# Patient Record
Sex: Male | Born: 1970 | Race: Black or African American | Hispanic: No | Marital: Married | Smoking: Never smoker
Health system: Southern US, Community
[De-identification: ages and names within clinical notes are randomized; demographics above are authoritative.]

## PROBLEM LIST (undated history)

## (undated) DIAGNOSIS — Z789 Other specified health status: Secondary | ICD-10-CM

## (undated) DIAGNOSIS — R7303 Prediabetes: Secondary | ICD-10-CM

---

## 1999-08-27 ENCOUNTER — Emergency Department (HOSPITAL_COMMUNITY): Admission: EM | Admit: 1999-08-27 | Discharge: 1999-08-27 | Payer: Self-pay | Admitting: Emergency Medicine

## 2000-12-06 ENCOUNTER — Emergency Department (HOSPITAL_COMMUNITY): Admission: EM | Admit: 2000-12-06 | Discharge: 2000-12-06 | Payer: Self-pay | Admitting: Emergency Medicine

## 2002-02-07 ENCOUNTER — Emergency Department (HOSPITAL_COMMUNITY): Admission: EM | Admit: 2002-02-07 | Discharge: 2002-02-07 | Payer: Self-pay | Admitting: Emergency Medicine

## 2003-02-27 ENCOUNTER — Encounter: Payer: Self-pay | Admitting: Emergency Medicine

## 2003-02-27 ENCOUNTER — Emergency Department (HOSPITAL_COMMUNITY): Admission: EM | Admit: 2003-02-27 | Discharge: 2003-02-27 | Payer: Self-pay | Admitting: Emergency Medicine

## 2003-05-13 ENCOUNTER — Encounter (HOSPITAL_BASED_OUTPATIENT_CLINIC_OR_DEPARTMENT_OTHER): Payer: Self-pay | Admitting: General Surgery

## 2003-05-13 ENCOUNTER — Ambulatory Visit (HOSPITAL_COMMUNITY): Admission: RE | Admit: 2003-05-13 | Discharge: 2003-05-13 | Payer: Self-pay | Admitting: General Surgery

## 2003-05-25 ENCOUNTER — Encounter (HOSPITAL_BASED_OUTPATIENT_CLINIC_OR_DEPARTMENT_OTHER): Payer: Self-pay | Admitting: General Surgery

## 2003-05-26 ENCOUNTER — Ambulatory Visit (HOSPITAL_COMMUNITY): Admission: RE | Admit: 2003-05-26 | Discharge: 2003-05-26 | Payer: Self-pay | Admitting: General Surgery

## 2003-05-26 ENCOUNTER — Encounter (INDEPENDENT_AMBULATORY_CARE_PROVIDER_SITE_OTHER): Payer: Self-pay

## 2004-09-25 ENCOUNTER — Emergency Department (HOSPITAL_COMMUNITY): Admission: EM | Admit: 2004-09-25 | Discharge: 2004-09-25 | Payer: Self-pay | Admitting: Emergency Medicine

## 2004-10-03 ENCOUNTER — Encounter: Admission: RE | Admit: 2004-10-03 | Discharge: 2004-10-31 | Payer: Self-pay | Admitting: Family Medicine

## 2009-12-11 ENCOUNTER — Emergency Department (HOSPITAL_COMMUNITY): Admission: EM | Admit: 2009-12-11 | Discharge: 2009-12-11 | Payer: Self-pay | Admitting: Emergency Medicine

## 2011-03-03 NOTE — Op Note (Signed)
NAME:  Trevor West, Trevor West                         ACCOUNT NO.:  [id number]   MEDICAL RECORD NO.:  [id number]                   PATIENT TYPE:  AMB   LOCATION:  DAY                                  FACILITY:  [facility]   PHYSICIAN:  Leonie Man, M.D.                DATE OF BIRTH:  [birth year]   DATE OF PROCEDURE:  05/26/2003  DATE OF DISCHARGE:                                 OPERATIVE REPORT   PREOPERATIVE DIAGNOSIS:  Mass of left hip, 12 x 12 cm, probable organized  hematoma.   POSTOPERATIVE DIAGNOSIS:  Mass of left hip, 12 x 12 cm, probable organized  hematoma.   OPERATION/PROCEDURE:  Excision mass of left hip.   SURGEON:  Leonie Man, M.D.   ASSISTANT:  Nurse.   ANESTHESIA:  General.   INDICATIONS:  This patient is a 40 year old man presenting with a large and  enlarging painless mass which has been present for about the last six months  over his great trochanter and acetabular area.  He underwent an MRI of this  mass which showed a large partially cystic, partially solid mass which was  then penetrated from the subcutaneous tissues down into the gluteal muscles.  He comes to the operating room now after risks and potential benefits of the  surgery have been fully discussed. All questions answered and consent  obtained.   DESCRIPTION OF PROCEDURE:  Following the induction of satisfactory general  anesthesia, the patient was positioned in the left lateral position.  The  left hip was prepped and draped into a full operative field.  A transverse  incision was carried down over the mass, through the skin and subcutaneous  tissue, carrying the dissection down to the capsule of the mass.  The mass  was dissected free on all sides, carrying it down into the gluteus muscle.  Upon entering into the gluteus muscle, capsule was opened and there was a  large amount of chocolate-colored debris within the mass and within the  gluteus.  The mass itself was excised and the debris from  within the gluteus  muscle was debrided completely and irrigated free.  The mass was sent for  frozen section.  Frozen section diagnosis confirmed this to be a benign  hematoma with a large fibrous sac.  Hemostasis was obtained with  electrocautery.  Sponge and instrument counts were verified.  The wound was  drained with a size 19-French Blake round drain.  The subcutaneous tissues  were then closed with interrupted 2-0 Vicryl suture and skin closed with 4-0  Monocryl suture, was then reinforced with Steri-Strips, the drain secured to  the skin and charged.  Sterile dressings applied.  Anesthetic was then  reversed, the patient removed from the operating room to the recovery room  in stable condition.  He tolerated the procedure well.  Leonie Man, M.D.    PB/MEDQ  D:  05/26/2003  T:  05/26/2003  Job:  [id number]

## 2016-01-15 ENCOUNTER — Emergency Department (HOSPITAL_COMMUNITY)
Admission: EM | Admit: 2016-01-15 | Discharge: 2016-01-15 | Disposition: A | Discharge status: Home / Self Care | Payer: Self-pay | Attending: Emergency Medicine | Admitting: Emergency Medicine

## 2016-01-15 ENCOUNTER — Emergency Department (HOSPITAL_COMMUNITY): Payer: Self-pay

## 2016-01-15 ENCOUNTER — Encounter (HOSPITAL_COMMUNITY): Payer: Self-pay | Admitting: Emergency Medicine

## 2016-01-15 DIAGNOSIS — R197 Diarrhea, unspecified: Secondary | ICD-10-CM | POA: Insufficient documentation

## 2016-01-15 DIAGNOSIS — R0602 Shortness of breath: Secondary | ICD-10-CM | POA: Insufficient documentation

## 2016-01-15 DIAGNOSIS — R6889 Other general symptoms and signs: Secondary | ICD-10-CM

## 2016-01-15 DIAGNOSIS — R509 Fever, unspecified: Secondary | ICD-10-CM | POA: Insufficient documentation

## 2016-01-15 DIAGNOSIS — R1110 Vomiting, unspecified: Secondary | ICD-10-CM | POA: Insufficient documentation

## 2016-01-15 DIAGNOSIS — R05 Cough: Secondary | ICD-10-CM | POA: Insufficient documentation

## 2016-01-15 DIAGNOSIS — J3489 Other specified disorders of nose and nasal sinuses: Secondary | ICD-10-CM | POA: Insufficient documentation

## 2016-01-15 DIAGNOSIS — M791 Myalgia: Secondary | ICD-10-CM | POA: Insufficient documentation

## 2016-01-15 DIAGNOSIS — R000 Tachycardia, unspecified: Secondary | ICD-10-CM | POA: Insufficient documentation

## 2016-01-15 LAB — COMPREHENSIVE METABOLIC PANEL
ALT: 33 U/L (ref 17–63)
AST: 29 U/L (ref 15–41)
Albumin: 4.5 g/dL (ref 3.5–5.0)
Alkaline Phosphatase: 46 U/L (ref 38–126)
Anion gap: 10 (ref 5–15)
BUN: 16 mg/dL (ref 6–20)
CO2: 22 mmol/L (ref 22–32)
CREATININE: 1.13 mg/dL (ref 0.61–1.24)
Calcium: 9.2 mg/dL (ref 8.9–10.3)
Chloride: 103 mmol/L (ref 101–111)
GFR calc Af Amer: >60 mL/min (ref >60)
GFR calc non Af Amer: >60 mL/min (ref >60)
GLUCOSE: 116 mg/dL — AB (ref 65–99)
Potassium: 4.3 mmol/L (ref 3.5–5.1)
Sodium: 135 mmol/L (ref 135–145)
Total Bilirubin: 1.1 mg/dL (ref 0.3–1.2)
Total Protein: 8.2 g/dL — ABNORMAL HIGH (ref 6.5–8.1)

## 2016-01-15 LAB — URINALYSIS, ROUTINE W REFLEX MICROSCOPIC
BILIRUBIN URINE: NEGATIVE
GLUCOSE, UA: NEGATIVE mg/dL
Hgb urine dipstick: NEGATIVE
Ketones, ur: 15 mg/dL — AB
Leukocytes, UA: NEGATIVE
Nitrite: NEGATIVE
PROTEIN: NEGATIVE mg/dL
Specific Gravity, Urine: 1.021 (ref 1.005–1.030)
pH: 6.5 (ref 5.0–8.0)

## 2016-01-15 LAB — CBC
HCT: 43.9 % (ref 39.0–52.0)
Hemoglobin: 14.5 g/dL (ref 13.0–17.0)
MCH: 25.6 pg — AB (ref 26.0–34.0)
MCHC: 33 g/dL (ref 30.0–36.0)
MCV: 77.6 fL — ABNORMAL LOW (ref 78.0–100.0)
Platelets: 280 10*3/uL (ref 150–400)
RBC: 5.66 MIL/uL (ref 4.22–5.81)
RDW: 14.4 % (ref 11.5–15.5)
WBC: 7.4 10*3/uL (ref 4.0–10.5)

## 2016-01-15 LAB — RAPID STREP SCREEN (MED CTR MEBANE ONLY): Streptococcus, Group A Screen (Direct): NEGATIVE

## 2016-01-15 LAB — I-STAT CG4 LACTIC ACID, ED: Lactic Acid, Venous: 0.73 mmol/L (ref 0.5–2.0)

## 2016-01-15 LAB — LIPASE, BLOOD: Lipase: 25 U/L (ref 11–51)

## 2016-01-15 MED ORDER — BENZONATATE 100 MG PO CAPS
100.0000 mg | ORAL_CAPSULE | Freq: Once | ORAL | Status: AC
  Administered 2016-01-15: 100 mg via ORAL
  Filled 2016-01-15: qty 1

## 2016-01-15 MED ORDER — ACETAMINOPHEN 325 MG PO TABS
650.0000 mg | ORAL_TABLET | Freq: Once | ORAL | Status: AC
  Administered 2016-01-15: 650 mg via ORAL
  Filled 2016-01-15: qty 2

## 2016-01-15 MED ORDER — ALBUTEROL SULFATE (2.5 MG/3ML) 0.083% IN NEBU
2.5000 mg | INHALATION_SOLUTION | Freq: Once | RESPIRATORY_TRACT | Status: AC
  Administered 2016-01-15: 2.5 mg via RESPIRATORY_TRACT
  Filled 2016-01-15: qty 3

## 2016-01-15 MED ORDER — NAPROXEN 500 MG PO TABS: 500.0000 mg | ORAL_TABLET | Freq: Two times a day (BID) | ORAL | Status: AC | PRN

## 2016-01-15 MED ORDER — IPRATROPIUM-ALBUTEROL 0.5-2.5 (3) MG/3ML IN SOLN
3.0000 mL | Freq: Once | RESPIRATORY_TRACT | Status: AC
  Administered 2016-01-15: 3 mL via RESPIRATORY_TRACT
  Filled 2016-01-15: qty 3

## 2016-01-15 MED ORDER — FLUTICASONE PROPIONATE 50 MCG/ACT NA SUSP: 2.0000 | Freq: Every day | NASAL | Status: AC | PRN

## 2016-01-15 MED ORDER — ALBUTEROL SULFATE HFA 108 (90 BASE) MCG/ACT IN AERS
2.0000 | INHALATION_SPRAY | Freq: Once | RESPIRATORY_TRACT | Status: AC
  Administered 2016-01-15: 2 via RESPIRATORY_TRACT
  Filled 2016-01-15: qty 6.7

## 2016-01-15 MED ORDER — BENZONATATE 100 MG PO CAPS: 100.0000 mg | ORAL_CAPSULE | Freq: Three times a day (TID) | ORAL | Status: AC | PRN

## 2016-01-15 MED ORDER — METHYLPREDNISOLONE SODIUM SUCC 125 MG IJ SOLR
125.0000 mg | Freq: Once | INTRAMUSCULAR | Status: AC
  Administered 2016-01-15: 125 mg via INTRAVENOUS
  Filled 2016-01-15: qty 2

## 2016-01-15 MED ORDER — KETOROLAC TROMETHAMINE 30 MG/ML IJ SOLN
30.0000 mg | Freq: Once | INTRAMUSCULAR | Status: AC
  Administered 2016-01-15: 30 mg via INTRAVENOUS
  Filled 2016-01-15: qty 1

## 2016-01-15 NOTE — ED Notes (Signed)
Pt from home with fever, chills, cough, nausea, vomiting, and diarrhea x 2 days. Pt has had 2 episodes of emesis and 1 of diarrhea in the past 24 hours. Pt's wife states she just got over the flu.

## 2016-01-15 NOTE — ED Provider Notes (Signed)
CSN: [id number]     Arrival date & time 01/15/16  0256 History   First MD Initiated Contact with Patient 01/15/16 [phone]     Chief Complaint  Patient presents with  . Cough  . Fever  . Emesis  . Diarrhea     (Consider location/radiation/quality/duration/timing/severity/associated sxs/prior Treatment) HPI Comments: 45 year old male with no significant past medical history presents to the emergency department for evaluation of shortness of breath. Patient states that he developed worsening shortness of breath 2 hours prior to arrival. Patient denies any modifying factors of his symptoms. No medications taken prior to coming to the ED. Patient has had an associated cough which is productive as well as chest congestion. He reports body aches as well as subjective fever and chills. Associated symptoms preceded shortness of breath by 3 days. He reports that his wife has recently been sick with a flulike illness. Patient with 2 episodes of emesis today and one episode of looser stool. He denies loss of consciousness, chest pain, abdominal pain, hemoptysis, hematemesis, melena, and hematochezia. No history of asthma, per patient. Patient has been taking ibuprofen for body aches without relief.  Patient is a 45 y.o. male presenting with cough, fever, vomiting, and diarrhea. The history is provided by the patient and the spouse. No language interpreter was used.  Cough Associated symptoms: chills, fever, myalgias, rhinorrhea and shortness of breath   Associated symptoms: no chest pain   Fever Associated symptoms: chills, congestion, cough, diarrhea, myalgias, rhinorrhea and vomiting   Associated symptoms: no chest pain   Emesis Associated symptoms: chills, diarrhea and myalgias   Associated symptoms: no abdominal pain   Diarrhea Associated symptoms: chills, fever, myalgias and vomiting   Associated symptoms: no abdominal pain     History reviewed. No pertinent past medical history. History  reviewed. No pertinent past surgical history. No family history on file. Social History  Substance Use Topics  . Smoking status: Never Smoker   . Smokeless tobacco: None  . Alcohol Use: Yes     Comment: occasionally     Review of Systems  Constitutional: Positive for fever and chills.  HENT: Positive for congestion and rhinorrhea.   Respiratory: Positive for cough and shortness of breath.   Cardiovascular: Negative for chest pain.  Gastrointestinal: Positive for vomiting and diarrhea. Negative for abdominal pain.  Musculoskeletal: Positive for myalgias.  All other systems reviewed and are negative.   Allergies  Review of patient's allergies indicates not on file.  Home Medications   Prior to Admission medications   Not on File   BP 137/91 mmHg  Pulse 98  Temp(Src) 99.8 F (37.7 C) (Oral)  Resp 22  SpO2 97%   Physical Exam  Constitutional: He is oriented to person, place, and time. He appears well-developed and well-nourished. No distress.  Nontoxic appearing  HENT:  Head: Normocephalic and atraumatic.  Eyes: Conjunctivae and EOM are normal. No scleral icterus.  Neck: Normal range of motion.  Cardiovascular: Regular rhythm and intact distal pulses.   Mild tachycardia  Pulmonary/Chest: Effort normal and breath sounds normal. No respiratory distress. He has no wheezes. He has no rales.  Patient moving air well. No tachypnea or accessory muscle usage. Lungs sounds clear bilaterally. No wheezing, rales, or rhonchi.  Musculoskeletal: Normal range of motion.  Neurological: He is alert and oriented to person, place, and time. He exhibits normal muscle tone. Coordination normal.  Skin: Skin is warm and dry. No rash noted. He is not diaphoretic. No erythema. No pallor.  Psychiatric: He has a normal mood and affect. His behavior is normal.  Nursing note and vitals reviewed.   ED Course  Procedures (including critical care time) Labs Review Labs Reviewed  COMPREHENSIVE  METABOLIC PANEL - Abnormal; Notable for the following:    Glucose, Bld 116 (*)    Total Protein 8.2 (*)    All other components within normal limits  CBC - Abnormal; Notable for the following:    MCV 77.6 (*)    MCH 25.6 (*)    All other components within normal limits  URINALYSIS, ROUTINE W REFLEX MICROSCOPIC (NOT AT [facility]) - Abnormal; Notable for the following:    Ketones, ur 15 (*)    All other components within normal limits  RAPID STREP SCREEN (NOT AT [facility])  CULTURE, GROUP A STREP ([facility])  LIPASE, BLOOD  I-STAT CG4 LACTIC ACID, ED    Imaging Review Dg Chest 2 View  01/15/2016  CLINICAL DATA:  Pt from home with fever, chills, cough, nausea, vomiting, and diarrhea x 2 days. Pt has had 2 episodes of emesis and 1 of diarrhea in the past 24 hours. Pt's wife states she just got over the flu. EXAM: CHEST  2 VIEW COMPARISON:  None. FINDINGS: Left hemidiaphragm elevation and with bilateral diaphragmatic eventration. Midline trachea. Normal heart size and mediastinal contours. No pleural effusion or pneumothorax. Clear lungs. IMPRESSION: No acute cardiopulmonary disease. Electronically Signed   By: Jeronimo GreavesKyle  Talbot M.D.   On: 01/15/2016 03:49   I have personally reviewed and evaluated these images and lab results as part of my medical decision-making.   EKG Interpretation None      MDM   Final diagnoses:  Flu-like symptoms    45 year old male presents to the emergency department for flulike symptoms which began 3 days ago. Wife was recently sick with similar symptoms. Patient afebrile on arrival without tachycardia or hypotension. No hypoxia. NO Sepsis/SIRS criteria. Patient with largest complaint of shortness of breath, mostly when sleeping and lying supine. He states that he feels as though he needs to "catch my breath" when he is falling asleep. Patient, however, has not experienced hypoxia in the ED when resting or with ambulation.  Symptoms mildly improved after DuoNeb and saline  nebulizer. Chest x-ray shows no evidence of pneumonia or other acute cardiopulmonary abnormality. Laboratory workup is reassuring. There is no leukocytosis or elevated lactate. Suspect influenza as cause of symptoms today. No indication for inpatient management at this time. Will continue with albuterol on an outpatient basis. Patient prescribed Tessalon for cough as well as Flonase and Naproxen. Primary care for up advised and return precautions given. Patient discharged in satisfactory condition with no unaddressed concerns. Patient case discussed with my attending, Dr. Karma GanjaLinker, who is in agreement with this workup, assessment, management plan, and patient's stability for discharge.   Filed Vitals:   01/15/16 0300 01/15/16 0454 01/15/16 0556  BP: 137/91  133/77  Pulse: 98  96  Temp: 99.8 F (37.7 C)    TempSrc: Oral    Resp: 22  20  SpO2: 97% 98% 97%       Antony MaduraKelly Zyrell Carmean, PA-C 01/15/16 [id number]  Jerelyn ScottMartha Linker, MD 01/15/16 [phone]

## 2016-01-15 NOTE — Discharge Instructions (Signed)
Take tylenol for fever and Naproxen for body aches. Use an albuterol inhaler, 2 puffs every 4-6 hours, as needed for shortness of breath. You may take Mucinex for congestion and/or use Flonase as prescribed. Take tessalon for cough. Follow up with your primary care doctor for a recheck of symptoms. Return to the ED if symptoms worsen.  Influenza, Adult Influenza ("the flu") is a viral infection of the respiratory tract. It occurs more often in winter months because people spend more time in close contact with one another. Influenza can make you feel very sick. Influenza easily spreads from person to person (contagious). CAUSES  Influenza is caused by a virus that infects the respiratory tract. You can catch the virus by breathing in droplets from an infected person's cough or sneeze. You can also catch the virus by touching something that was recently contaminated with the virus and then touching your mouth, nose, or eyes. RISKS AND COMPLICATIONS You may be at risk for a more severe case of influenza if you smoke cigarettes, have diabetes, have chronic heart disease (such as heart failure) or lung disease (such as asthma), or if you have a weakened immune system. Elderly people and pregnant women are also at risk for more serious infections. The most common problem of influenza is a lung infection (pneumonia). Sometimes, this problem can require emergency medical care and may be life threatening. SIGNS AND SYMPTOMS  Symptoms typically last 4 to 10 days and may include:  Fever.  Chills.  Headache, body aches, and muscle aches.  Sore throat.  Chest discomfort and cough.  Poor appetite.  Weakness or feeling tired.  Dizziness.  Nausea or vomiting. DIAGNOSIS  Diagnosis of influenza is often made based on your history and a physical exam. A nose or throat swab test can be done to confirm the diagnosis. TREATMENT  In mild cases, influenza goes away on its own. Treatment is directed at  relieving symptoms. For more severe cases, your health care provider may prescribe antiviral medicines to shorten the sickness. Antibiotic medicines are not effective because the infection is caused by a virus, not by bacteria. HOME CARE INSTRUCTIONS  Take medicines only as directed by your health care provider.  Use a cool mist humidifier to make breathing easier.  Get plenty of rest until your temperature returns to normal. This usually takes 3 to 4 days.  Drink enough fluid to keep your urine clear or pale yellow.  Cover yourmouth and nosewhen coughing or sneezing,and wash your handswellto prevent thevirusfrom spreading.  Stay homefromwork orschool untilthe fever is gonefor at least 531full day. PREVENTION  An annual influenza vaccination (flu shot) is the best way to avoid getting influenza. An annual flu shot is now routinely recommended for all adults in the [place] SEEK MEDICAL CARE IF:  You experiencechest pain, yourcough worsens,or you producemore mucus.  Youhave nausea,vomiting, ordiarrhea.  Your fever returns or gets worse. SEEK IMMEDIATE MEDICAL CARE IF:  You havetrouble breathing, you become short of breath,or your skin ornails becomebluish.  You have severe painor stiffnessin the neck.  You develop a sudden headache, or pain in the face or ear.  You have nausea or vomiting that you cannot control. MAKE SURE YOU:   Understand these instructions.  Will watch your condition.  Will get help right away if you are not doing well or get worse.   This information is not intended to replace advice given to you by your health care provider. Make sure you discuss any questions you  have with your health care provider.   Document Released: 09/29/2000 Document Revised: 10/23/2014 Document Reviewed: 01/01/2012 Elsevier Interactive Patient Education [employer].

## 2016-01-17 LAB — CULTURE, GROUP A STREP (THRC)

## 2017-11-08 IMAGING — CR DG CHEST 2V
2 series · 2 of 2 positions shown · non-contrast
Comparison: None.

CLINICAL DATA: Pt from home with fever, chills, cough, nausea,
vomiting, and diarrhea x 2 days. Pt has had 2 episodes of emesis and
1 of diarrhea in the past 24 hours. Pt's wife states she just got
over the flu.

EXAM:
CHEST  2 VIEW

[w chest pa]
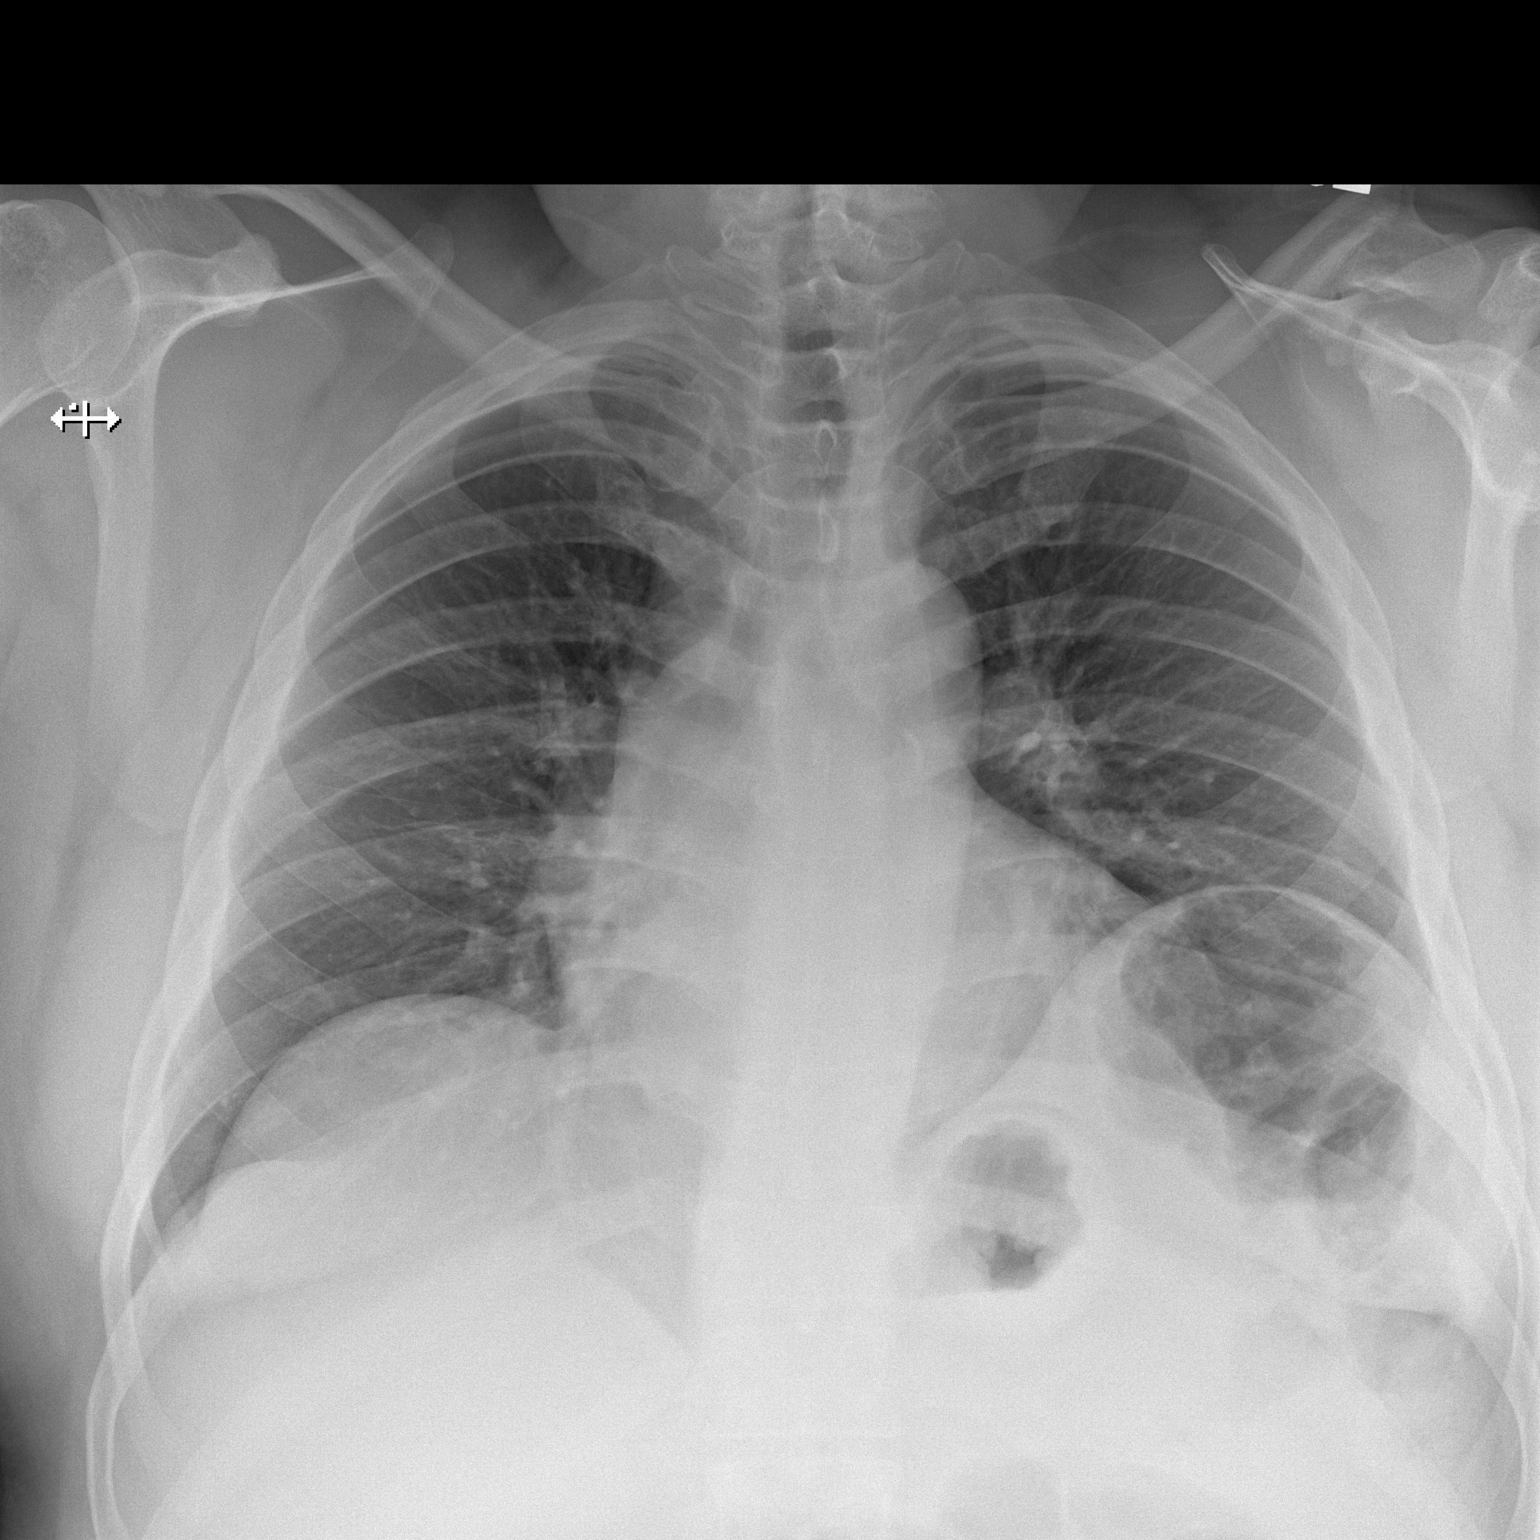

[w chest lat]
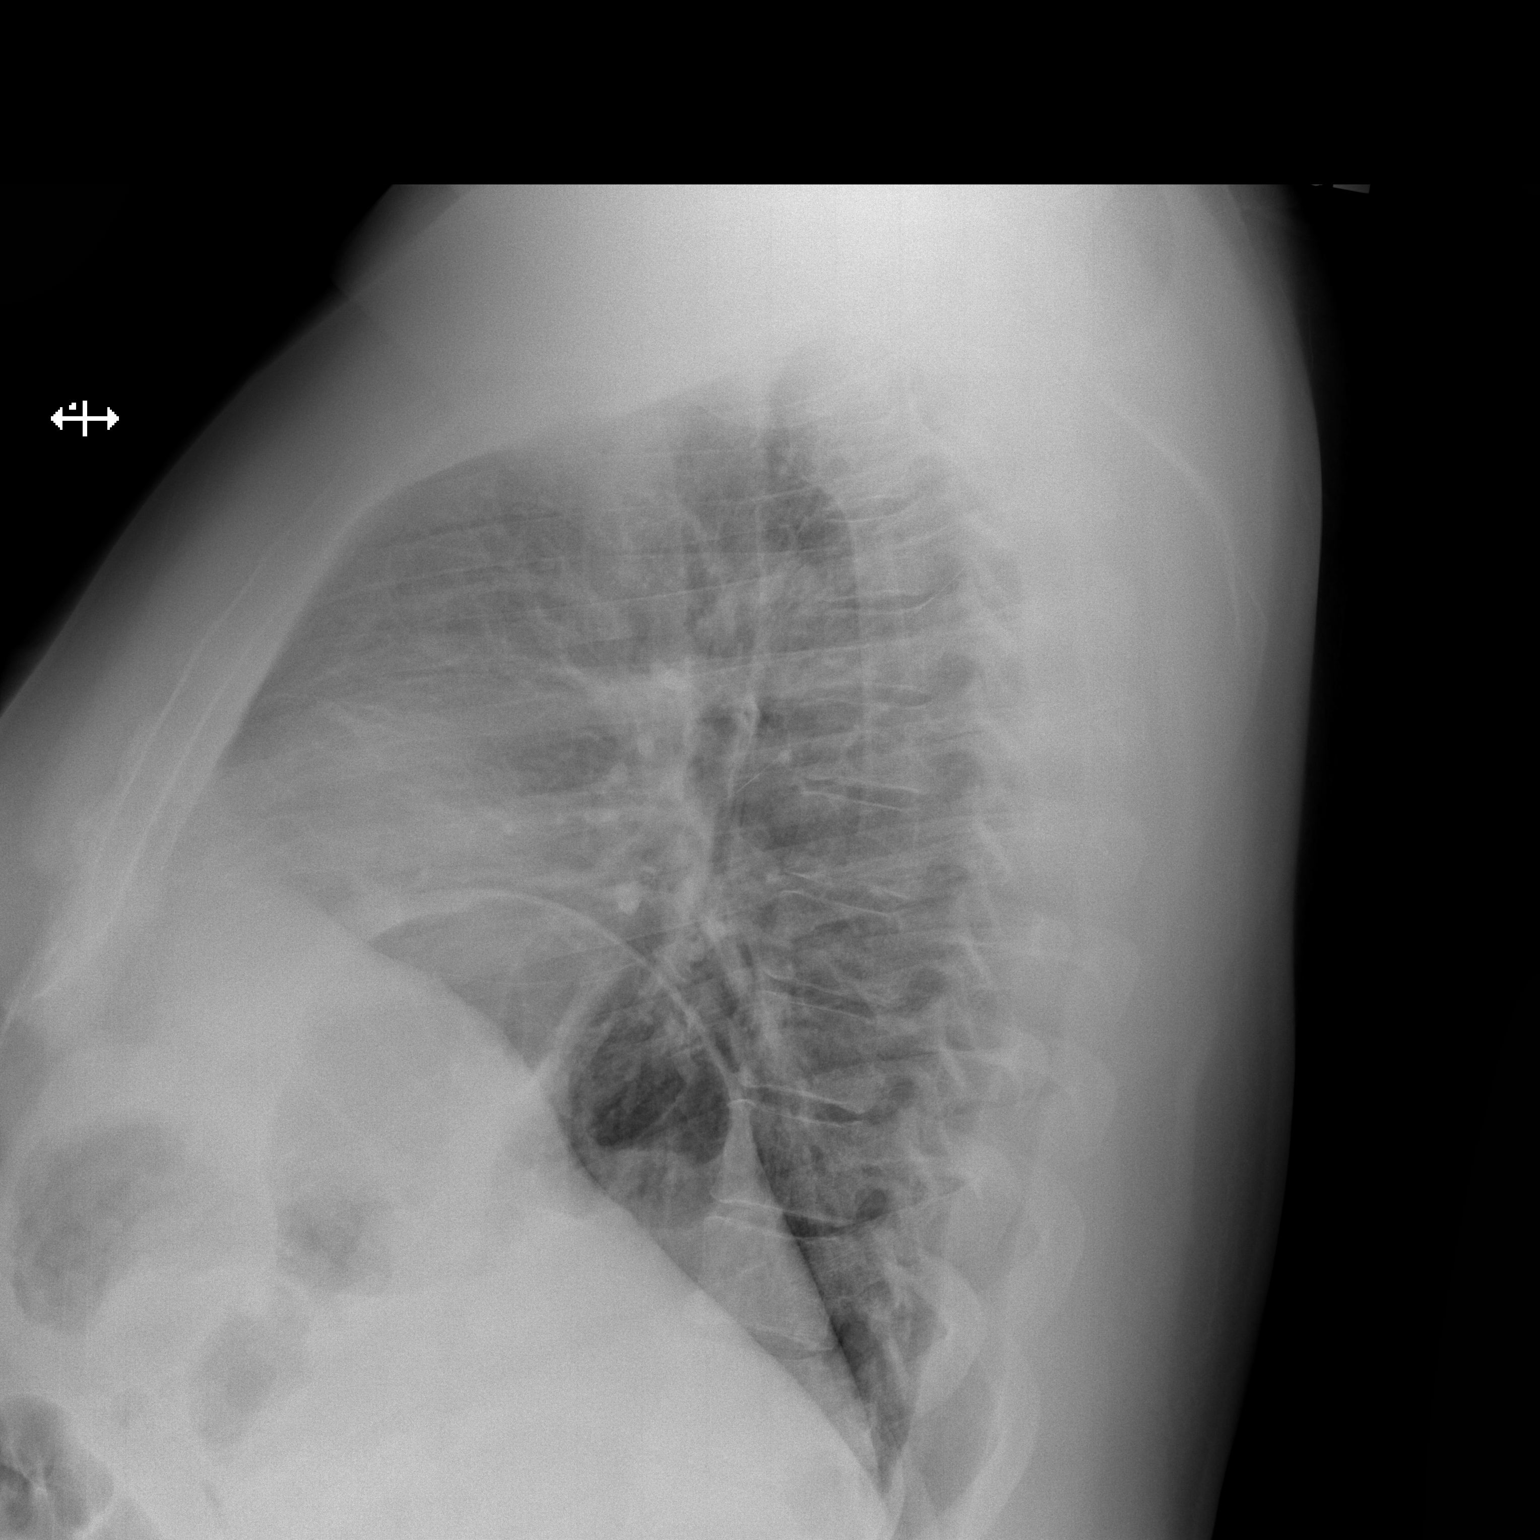

[2 of 2 positions shown; findings below may reference images not displayed]

FINDINGS: Left hemidiaphragm elevation and with bilateral diaphragmatic
eventration. Midline trachea. Normal heart size and mediastinal
contours. No pleural effusion or pneumothorax. Clear lungs.
IMPRESSION: No acute cardiopulmonary disease.

## 2017-12-28 ENCOUNTER — Other Ambulatory Visit (HOSPITAL_COMMUNITY): Payer: Self-pay | Admitting: General Surgery

## 2018-01-09 ENCOUNTER — Ambulatory Visit: Payer: BLUE CROSS/BLUE SHIELD | Admitting: Registered"

## 2018-01-15 ENCOUNTER — Encounter (HOSPITAL_COMMUNITY): Payer: Self-pay

## 2018-01-15 ENCOUNTER — Ambulatory Visit (HOSPITAL_COMMUNITY): Payer: BLUE CROSS/BLUE SHIELD

## 2021-01-09 ENCOUNTER — Encounter (HOSPITAL_COMMUNITY): Payer: Self-pay

## 2021-01-09 ENCOUNTER — Emergency Department (HOSPITAL_COMMUNITY)
Admission: EM | Admit: 2021-01-09 | Discharge: 2021-01-09 | Disposition: A | Discharge status: Home / Self Care | Payer: BC Managed Care – PPO | Attending: Emergency Medicine | Admitting: Emergency Medicine

## 2021-01-09 ENCOUNTER — Other Ambulatory Visit: Payer: Self-pay

## 2021-01-09 ENCOUNTER — Emergency Department (HOSPITAL_COMMUNITY): Payer: BC Managed Care – PPO

## 2021-01-09 DIAGNOSIS — R072 Precordial pain: Secondary | ICD-10-CM | POA: Diagnosis present

## 2021-01-09 HISTORY — DX: Other specified health status: Z78.9

## 2021-01-09 LAB — CBC
HCT: 43.1 % (ref 39.0–52.0)
Hemoglobin: 13.6 g/dL (ref 13.0–17.0)
MCH: 25.7 pg — ABNORMAL LOW (ref 26.0–34.0)
MCHC: 31.6 g/dL (ref 30.0–36.0)
MCV: 81.3 fL (ref 80.0–100.0)
Platelets: 300 10*3/uL (ref 150–400)
RBC: 5.3 MIL/uL (ref 4.22–5.81)
RDW: 14.5 % (ref 11.5–15.5)
WBC: 4.5 10*3/uL (ref 4.0–10.5)
nRBC: 0 % (ref 0.0–0.2)

## 2021-01-09 LAB — BASIC METABOLIC PANEL
Anion gap: 7 (ref 5–15)
BUN: 14 mg/dL (ref 6–20)
CO2: 27 mmol/L (ref 22–32)
Calcium: 8.5 mg/dL — ABNORMAL LOW (ref 8.9–10.3)
Chloride: 104 mmol/L (ref 98–111)
Creatinine, Ser: 1.07 mg/dL (ref 0.61–1.24)
GFR, Estimated: >60 mL/min (ref >60)
Glucose, Bld: 110 mg/dL — ABNORMAL HIGH (ref 70–99)
Potassium: 3.6 mmol/L (ref 3.5–5.1)
Sodium: 138 mmol/L (ref 135–145)

## 2021-01-09 LAB — TROPONIN I (HIGH SENSITIVITY): Troponin I (High Sensitivity): 4 ng/L (ref <18)

## 2021-01-09 MED ORDER — DICLOFENAC SODIUM 1 % EX GEL: 2.0000 g | Freq: Four times a day (QID) | CUTANEOUS | 0 refills | Status: AC

## 2021-01-09 NOTE — Discharge Instructions (Signed)
You were seen in the emergency department today with chest pain.  Your work-up today did not show evidence of a heart attack.  If your symptoms suddenly worsen or change he should return to the emergency department, otherwise please follow with your primary care doctor in the coming week.

## 2021-01-09 NOTE — ED Provider Notes (Signed)
Emergency Department Provider Note   I have reviewed the triage vital signs and the nursing notes.   HISTORY  Chief Complaint No chief complaint on file.   HPI Trevor West is a 50 y.o. male with past medical history reviewed below presents to the emergency department with intermittent, brief, pulling chest discomfort in the left upper chest.  No provoking or modifying factors.  Patient describes pain which comes intermittently and does not radiate.  He is not feeling short of breath.  No active pain at this time.  He states and the pain does start it last for only a second or two and then resolve.  No pleuritic pain. No fever/chills. No DVT/PT history. Pain as been intermittent over the last 2 days.   Past Medical History:  Diagnosis Date  . Known health problems: none     There are no problems to display for this patient.   No past surgical history on file.  Allergies Patient has no known allergies.  No family history on file.  Social History Social History   Tobacco Use  . Smoking status: Never Smoker  . Smokeless tobacco: Never Used  Vaping Use  . Vaping Use: Never used  Substance Use Topics  . Alcohol use: Yes    Comment: occasionally   . Drug use: Not Currently    Review of Systems  Constitutional: No fever/chills Eyes: No visual changes. ENT: No sore throat. Cardiovascular: Positive chest pain. Respiratory: Denies shortness of breath. Gastrointestinal: No abdominal pain.  No nausea, no vomiting.  No diarrhea.  No constipation. Genitourinary: Negative for dysuria. Musculoskeletal: Negative for back pain. Skin: Negative for rash. Neurological: Negative for headaches, focal weakness or numbness.  10-point ROS otherwise negative.  ____________________________________________   PHYSICAL EXAM:  VITAL SIGNS: ED Triage Vitals  Enc Vitals Group     BP 01/09/21 0419 (!) 151/104     Pulse Rate 01/09/21 0419 73     Resp 01/09/21 0419 20     Temp  01/09/21 0419 97.7 F (36.5 C)     Temp Source 01/09/21 0419 Oral     SpO2 01/09/21 0419 94 %     Weight --      Height 01/09/21 0427 6' (1.829 m)   Constitutional: Alert and oriented. Well appearing and in no acute distress. Eyes: Conjunctivae are normal.  Head: Atraumatic. Nose: No congestion/rhinnorhea. Mouth/Throat: Mucous membranes are moist.  Neck: No stridor.   Cardiovascular: Normal rate, regular rhythm. Good peripheral circulation. Grossly normal heart sounds.   Respiratory: Normal respiratory effort.  No retractions. Lungs CTAB. Gastrointestinal: Soft and nontender. No distention.  Musculoskeletal: No lower extremity tenderness nor edema. No gross deformities of extremities. Neurologic:  Normal speech and language. No gross focal neurologic deficits are appreciated.  Skin:  Skin is warm, dry and intact. No rash noted.  ____________________________________________   LABS (all labs ordered are listed, but only abnormal results are displayed)  Labs Reviewed  BASIC METABOLIC PANEL - Abnormal; Notable for the following components:      Result Value   Glucose, Bld 110 (*)    Calcium 8.5 (*)    All other components within normal limits  CBC - Abnormal; Notable for the following components:   MCH 25.7 (*)    All other components within normal limits  TROPONIN I (HIGH SENSITIVITY)  TROPONIN I (HIGH SENSITIVITY)   ____________________________________________  EKG   EKG Interpretation  Date/Time:  Sunday January 09 2021 04:21:11 EDT Ventricular Rate:  74 PR Interval:  202 QRS Duration: 94 QT Interval:  402 QTC Calculation: 446 R Axis:   -17 Text Interpretation: Normal sinus rhythm Inferior infarct , age undetermined Abnormal ECG No STEMI Confirmed by Alona Bene [phone]) on 01/09/2021 4:38:56 AM       ____________________________________________  RADIOLOGY  DG Chest 2 View  Result Date: 01/09/2021 CLINICAL DATA:  Chest pain EXAM: CHEST - 2 VIEW COMPARISON:   01/15/2016 FINDINGS: Lungs are clear. No pleural effusion or pneumothorax. Stable mild eventration of the left hemidiaphragm. The heart is normal in size. Visualized osseous structures are within normal limits. IMPRESSION: Normal chest radiographs. Electronically Signed   By: Charline Bills M.D.   On: 01/09/2021 05:12    ____________________________________________   PROCEDURES  Procedure(s) performed:   Procedures  None ____________________________________________   INITIAL IMPRESSION / ASSESSMENT AND PLAN / ED COURSE  Pertinent labs & imaging results that were available during my care of the patient were reviewed by me and considered in my medical decision making (see chart for details).   Patient presents to the ED with a somewhat atypical pain in the chest. Pulling type pain has been constant for > 6 hours. No SOB. Troponin is negative. CR reviewed and clear. EKG interpreted by me with no acute findings. Vitals are stable here.   Patient stable for discharge. Plan for PCP follow up. Discussed ED return precautions.  ____________________________________________  FINAL CLINICAL IMPRESSION(S) / ED DIAGNOSES  Final diagnoses:  Precordial chest pain    NEW OUTPATIENT MEDICATIONS STARTED DURING THIS VISIT:  Discharge Medication List as of 01/09/2021  5:46 AM    START taking these medications   Details  diclofenac Sodium (VOLTAREN) 1 % GEL Apply 2 g topically 4 (four) times daily., Starting Sun 01/09/2021, Normal        Note:  This document was prepared using Dragon voice recognition software and may include unintentional dictation errors.  Alona Bene, MD, [facility]    Kortney Potvin, Arlyss Repress, MD 01/11/21 [phone]

## 2021-01-09 NOTE — ED Triage Notes (Signed)
Pt reports that he is having pain in the left side of his chest. Started 2 days ago. Denies SOB, nausea/vomiting. Pain is described as pulling.

## 2021-10-21 ENCOUNTER — Other Ambulatory Visit: Payer: Self-pay | Admitting: Family Medicine

## 2021-10-21 DIAGNOSIS — R310 Gross hematuria: Secondary | ICD-10-CM

## 2021-10-24 ENCOUNTER — Ambulatory Visit
Admission: RE | Admit: 2021-10-24 | Discharge: 2021-10-24 | Disposition: A | Discharge status: Home / Self Care | Payer: BC Managed Care – PPO | Source: Ambulatory Visit | Attending: Family Medicine | Admitting: Family Medicine

## 2021-10-24 DIAGNOSIS — R310 Gross hematuria: Secondary | ICD-10-CM

## 2021-12-08 ENCOUNTER — Other Ambulatory Visit (HOSPITAL_COMMUNITY): Payer: Self-pay | Admitting: Cardiovascular Disease

## 2021-12-08 ENCOUNTER — Other Ambulatory Visit: Payer: Self-pay | Admitting: Cardiovascular Disease

## 2021-12-08 DIAGNOSIS — R0602 Shortness of breath: Secondary | ICD-10-CM

## 2022-01-29 ENCOUNTER — Encounter (HOSPITAL_COMMUNITY): Payer: Self-pay

## 2022-11-03 IMAGING — CR DG CHEST 2V
2 series · 2 of 2 positions shown · non-contrast
Comparison: 01/15/2016

CLINICAL DATA: Chest pain

EXAM:
CHEST - 2 VIEW

[chest pa]
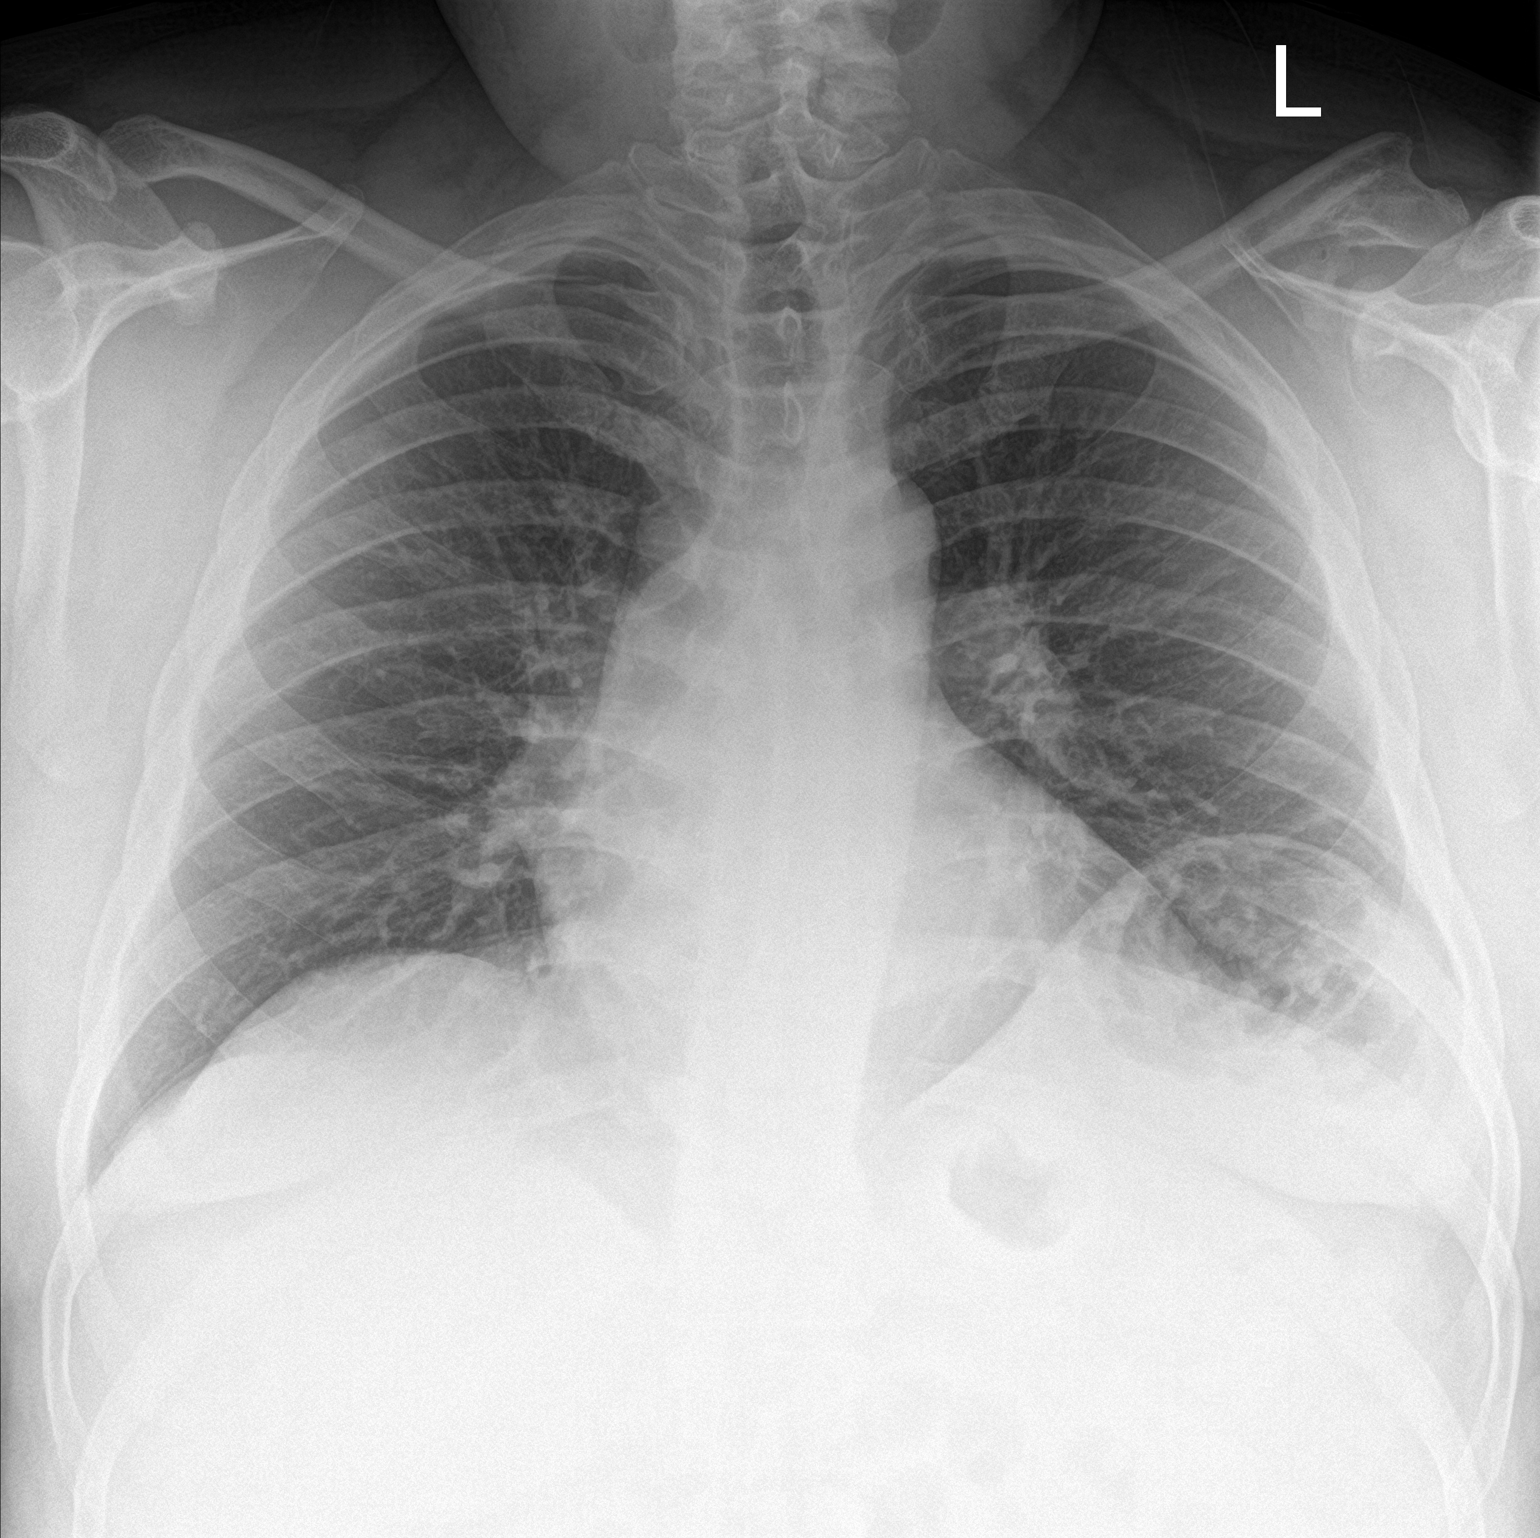

[chest lat]
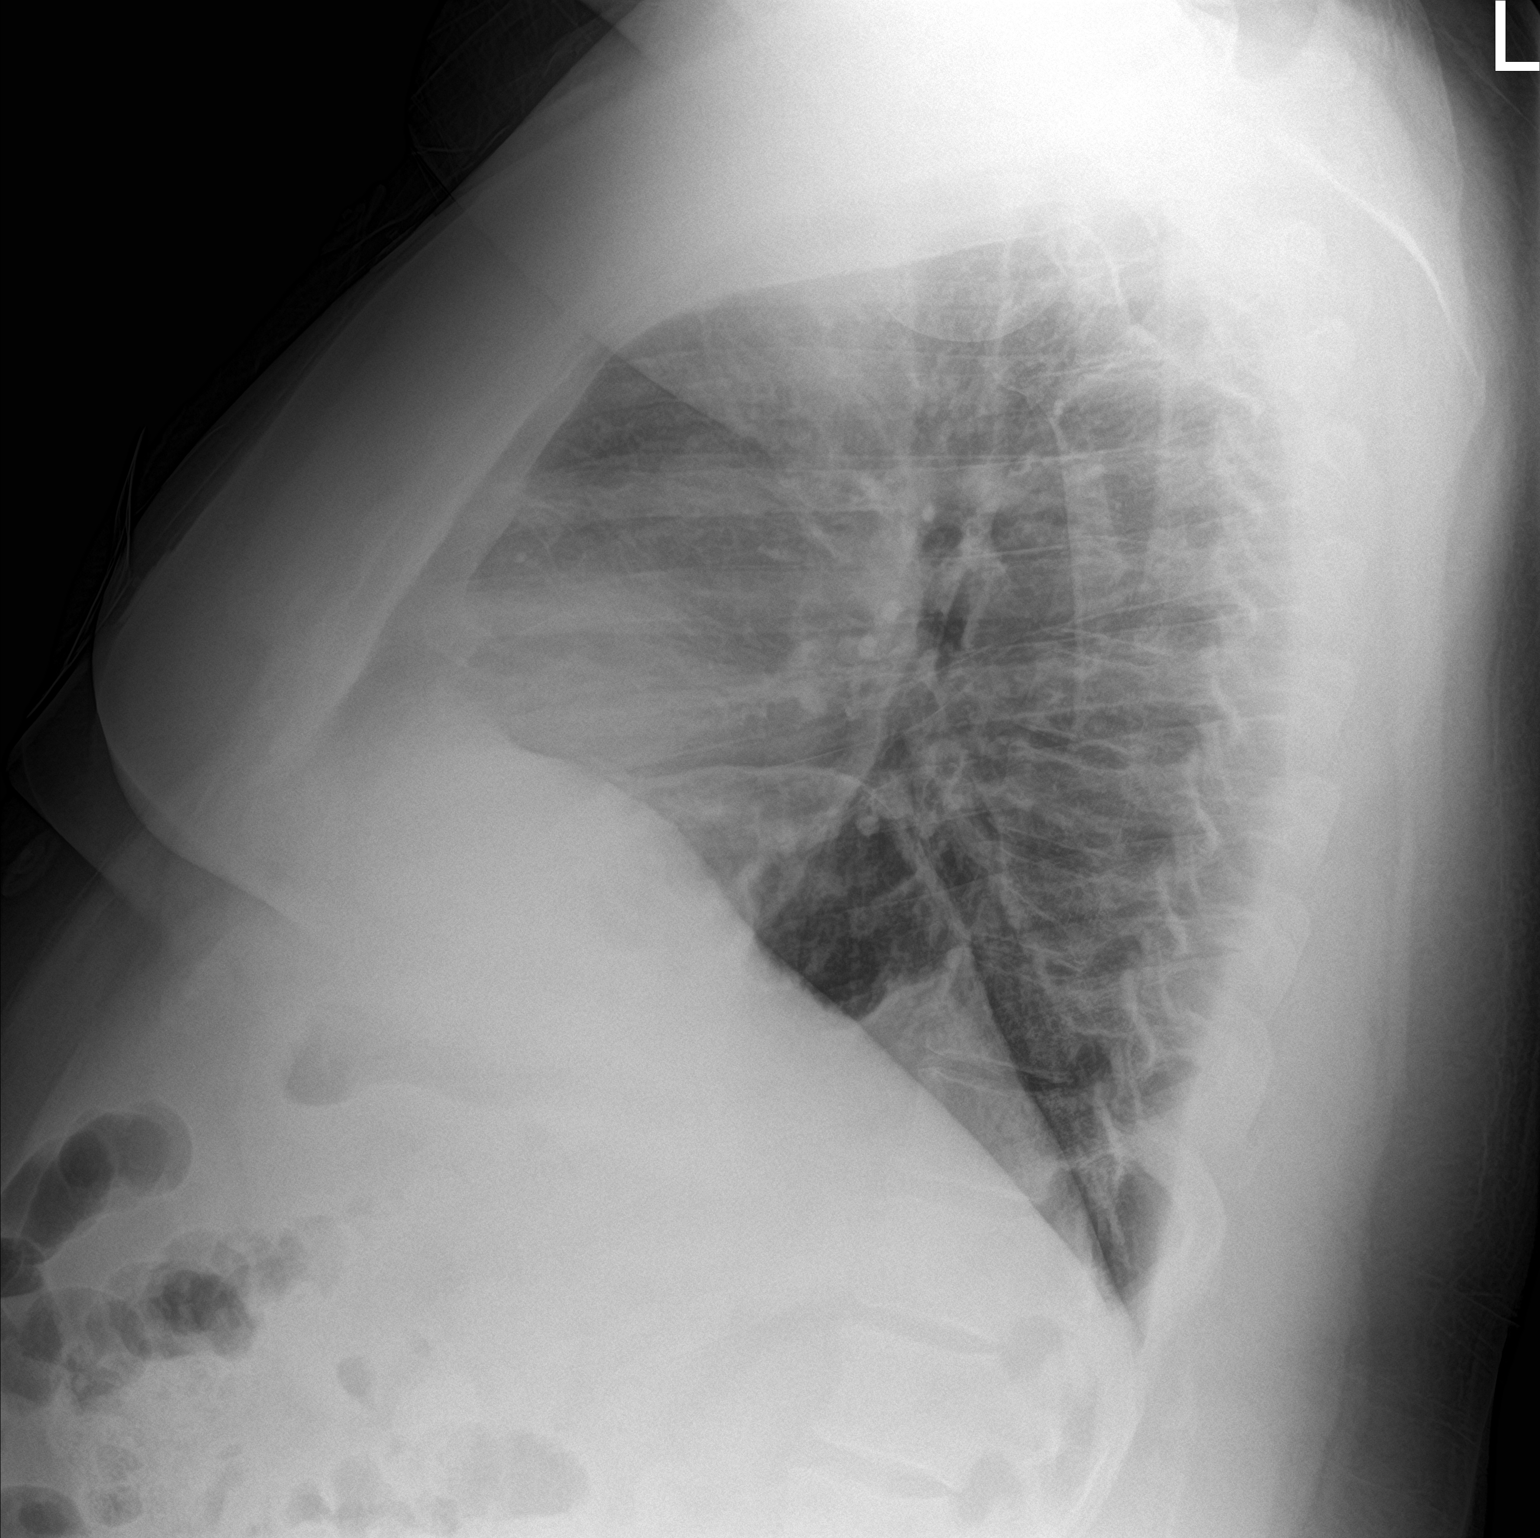

[2 of 2 positions shown; findings below may reference images not displayed]

FINDINGS: Lungs are clear. No pleural effusion or pneumothorax. Stable mild
eventration of the left hemidiaphragm.

The heart is normal in size.

Visualized osseous structures are within normal limits.
IMPRESSION: Normal chest radiographs.

## 2023-06-07 ENCOUNTER — Emergency Department (HOSPITAL_COMMUNITY)
Admission: EM | Admit: 2023-06-07 | Discharge: 2023-06-07 | Disposition: A | Discharge status: Home / Self Care | Payer: BC Managed Care – PPO | Attending: Emergency Medicine | Admitting: Emergency Medicine

## 2023-06-07 ENCOUNTER — Encounter (HOSPITAL_COMMUNITY): Payer: Self-pay

## 2023-06-07 DIAGNOSIS — E1140 Type 2 diabetes mellitus with diabetic neuropathy, unspecified: Secondary | ICD-10-CM | POA: Diagnosis not present

## 2023-06-07 DIAGNOSIS — R200 Anesthesia of skin: Secondary | ICD-10-CM | POA: Diagnosis present

## 2023-06-07 DIAGNOSIS — G629 Polyneuropathy, unspecified: Secondary | ICD-10-CM

## 2023-06-07 LAB — BASIC METABOLIC PANEL
Anion gap: 12 (ref 5–15)
BUN: 10 mg/dL (ref 6–20)
CO2: 22 mmol/L (ref 22–32)
Calcium: 8.6 mg/dL — ABNORMAL LOW (ref 8.9–10.3)
Chloride: 105 mmol/L (ref 98–111)
Creatinine, Ser: 0.96 mg/dL (ref 0.61–1.24)
GFR, Estimated: >60 mL/min (ref >60)
Glucose, Bld: 94 mg/dL (ref 70–99)
Potassium: 3.9 mmol/L (ref 3.5–5.1)
Sodium: 139 mmol/L (ref 135–145)

## 2023-06-07 LAB — MAGNESIUM: Magnesium: 2 mg/dL (ref 1.7–2.4)

## 2023-06-07 LAB — CBG MONITORING, ED: Glucose-Capillary: 97 mg/dL (ref 70–99)

## 2023-06-07 NOTE — ED Provider Notes (Signed)
[place] EMERGENCY DEPARTMENT AT [facility] Provider Note   CSN: [id number] Arrival date & time: 06/07/23  1205     History  Chief Complaint  Patient presents with   Numbness in foot    Trevor West is a 52 y.o. male with past medical history significant for diabetes presents to the ED complaining of numbness and tingling pain in his right foot.  Patient does not have previous diagnosis of neuropathy.  He is on Mounjaro, but no other medications for diabetes.  He does not check his blood sugar at home.  He states the problem has been going on for the past 3 weeks.  He states it began in his hip and has moved its way down to his 3rd-5th toes on the right foot.  Denies weakness, numbness in the leg, difficulty walking, swelling of the foot or leg.          Home Medications Prior to Admission medications   Medication Sig Start Date End Date Taking? Authorizing Provider  ibuprofen (ADVIL,MOTRIN) 200 MG tablet Take 400 mg by mouth every 6 (six) hours as needed for headache, mild pain or moderate pain.   Yes [provider]  MOUNJARO 5 MG/0.5ML Pen Inject 5 mg into the skin once a week. 05/06/23  Yes [provider]  rosuvastatin (CRESTOR) 10 MG tablet Take 10 mg by mouth at bedtime. 04/13/23  Yes [provider]  benzonatate (TESSALON) 100 MG capsule Take 1 capsule (100 mg total) by mouth 3 (three) times daily as needed for cough. Patient not taking: Reported on 06/07/2023 01/15/16   Antony Madura, PA-C  diclofenac Sodium (VOLTAREN) 1 % GEL Apply 2 g topically 4 (four) times daily. Patient not taking: Reported on 06/07/2023 01/09/21   Maia Plan, MD  fluticasone [facility]) 50 MCG/ACT nasal spray Place 2 sprays into both nostrils daily as needed (for nasal congestion). Patient not taking: Reported on 06/07/2023 01/15/16   Antony Madura, PA-C  naproxen (NAPROSYN) 500 MG tablet Take 1 tablet (500 mg total) by mouth 2 (two) times daily as needed for mild pain  or moderate pain (body aches). Patient not taking: Reported on 06/07/2023 01/15/16   Antony Madura, PA-C      Allergies    Patient has no known allergies.    Review of Systems   Review of Systems  Musculoskeletal:  Negative for gait problem and joint swelling.  Neurological:  Positive for numbness (numbness and tingling right toes). Negative for dizziness, syncope, weakness and light-headedness.    Physical Exam Updated Vital Signs BP 109/64   Pulse 70   Temp 97.7 F (36.5 C) (Oral)   Resp 18   SpO2 100%  Physical Exam Vitals and nursing note reviewed.  Constitutional:      General: He is not in acute distress.    Appearance: Normal appearance. He is not ill-appearing or diaphoretic.  Cardiovascular:     Rate and Rhythm: Normal rate and regular rhythm.     Pulses:          Dorsalis pedis pulses are 2+ on the right side and 2+ on the left side.       Posterior tibial pulses are 2+ on the right side and 2+ on the left side.  Pulmonary:     Effort: Pulmonary effort is normal.  Musculoskeletal:     Right lower leg: No edema.     Left lower leg: No edema.  Feet:     Right foot:  Skin integrity: Skin integrity normal.     Toenail Condition: Right toenails are normal.     Left foot:     Skin integrity: Skin integrity normal.     Toenail Condition: Left toenails are normal.     Comments: Sensation is intact across plantar surface of both feet.  Patient reports mildly decreased sensation on the tips of 3rd-5th toes of right foot.   Skin:    General: Skin is warm and dry.     Capillary Refill: Capillary refill takes less than 2 seconds.  Neurological:     Mental Status: He is alert. Mental status is at baseline.     Motor: Motor function is intact. No weakness.     Gait: Gait is intact.  Psychiatric:        Mood and Affect: Mood normal.        Behavior: Behavior normal.     ED Results / Procedures / Treatments   Labs (all labs ordered are listed, but only abnormal  results are displayed) Labs Reviewed  BASIC METABOLIC PANEL - Abnormal; Notable for the following components:      Result Value   Calcium 8.6 (*)    All other components within normal limits  MAGNESIUM  CBG MONITORING, ED    EKG None  Radiology No results found.  Procedures Procedures    Medications Ordered in ED Medications - No data to display  ED Course/ Medical Decision Making/ A&P                                 Medical Decision Making  This patient presents to the ED with chief complaint(s) of numbness and tingling in right toes with pertinent past medical history of diabetes.  The complaint involves an extensive differential diagnosis and also carries with it a high risk of complications and morbidity.    The differential diagnosis includes diabetic neuropathy, electrolyte disturbance, other neuropathy, peripheral vascular disease   The initial plan is to obtain glucose, magnesium, and BMP  Initial Assessment:   On exam, patient is overall well-appearing and is not in acute distress.  Sensation is intact across plantar surface of both feet.  Patient reports mildly decreased sensation on the tips of 3rd-5th toes of right foot.  Mildly decreased sensation to lateral ankle.  Normal sensation otherwise to foot, ankle, and right leg.  5/5 strength in bilateral lower extremities.  Gait intact.    Independent ECG/labs interpretation:  The following labs were independently interpreted:  Metabolic panel with mild hypocalcemia, no other electrolyte disturbance.  Magnesium normal.  No hyper or hypoglycemia.   Labs resulted after patient had already left ED.    Disposition:   Discussed with patient high suspicion for diabetic neuropathy and potential treatment options as well as PCP follow up.  Labs were also ordered to assess for electrolyte disturbance contributing.  Informed by Kem Boroughs, Paramedic that patient does not wish to wait for labs due to having a meeting to go  to.  Patient left AGAINST MEDICAL ADVICE prior to completion of workup.            Final Clinical Impression(s) / ED Diagnoses Final diagnoses:  Neuropathy    Rx / DC Orders ED Discharge Orders     None         Lenard Simmer, PA-C 06/07/23 1506    Pricilla Loveless, MD 06/07/23 [phone]

## 2023-06-07 NOTE — ED Triage Notes (Signed)
Coming in for a numbness tingling pain in his foot, Hx of diabetes but never diagnosed with neuropathy. Pt is no on gabapentin. He was ambulatory to the room with no weakness, no neurological changes or symptoms. He also expresses he has had this numbness tingling sensation in his for for 3 weeks.

## 2023-06-12 ENCOUNTER — Emergency Department (HOSPITAL_COMMUNITY)
Admission: EM | Admit: 2023-06-12 | Discharge: 2023-06-13 | Discharge status: Against Medical Advice | Payer: BC Managed Care – PPO | Attending: Emergency Medicine | Admitting: Emergency Medicine

## 2023-06-12 ENCOUNTER — Encounter (HOSPITAL_COMMUNITY): Payer: Self-pay

## 2023-06-12 DIAGNOSIS — M79671 Pain in right foot: Secondary | ICD-10-CM | POA: Insufficient documentation

## 2023-06-12 DIAGNOSIS — E119 Type 2 diabetes mellitus without complications: Secondary | ICD-10-CM | POA: Diagnosis not present

## 2023-06-12 DIAGNOSIS — Z5321 Procedure and treatment not carried out due to patient leaving prior to being seen by health care provider: Secondary | ICD-10-CM | POA: Insufficient documentation

## 2023-06-12 NOTE — ED Triage Notes (Addendum)
Pt is coming in for right foot pain that has been an ongoing issue for around 2 weeks but progressively worsening tonight. It is only located in the right foot. Hard to visualize swelling when compared to the left but may be minimally swollen. States the pain he feels can feeling like a " v static " pain or sharp pain. He has no diagnosis of neuropathy. Pt is otherwise stable with no trauma or injury occurring to the foot and no other complaints at this time.  He also mentions that his sugars are controlled at home and is a diabetic

## 2023-06-14 ENCOUNTER — Emergency Department (HOSPITAL_COMMUNITY)
Admission: EM | Admit: 2023-06-14 | Discharge: 2023-06-14 | Disposition: A | Discharge status: Home / Self Care | Payer: BC Managed Care – PPO | Attending: Emergency Medicine | Admitting: Emergency Medicine

## 2023-06-14 ENCOUNTER — Encounter (HOSPITAL_COMMUNITY): Payer: Self-pay

## 2023-06-14 ENCOUNTER — Emergency Department (HOSPITAL_COMMUNITY): Payer: BC Managed Care – PPO

## 2023-06-14 ENCOUNTER — Other Ambulatory Visit: Payer: Self-pay

## 2023-06-14 DIAGNOSIS — M48061 Spinal stenosis, lumbar region without neurogenic claudication: Secondary | ICD-10-CM | POA: Diagnosis not present

## 2023-06-14 DIAGNOSIS — M5431 Sciatica, right side: Secondary | ICD-10-CM

## 2023-06-14 DIAGNOSIS — M545 Low back pain, unspecified: Secondary | ICD-10-CM | POA: Diagnosis present

## 2023-06-14 DIAGNOSIS — M5441 Lumbago with sciatica, right side: Secondary | ICD-10-CM | POA: Insufficient documentation

## 2023-06-14 HISTORY — DX: Prediabetes: R73.03

## 2023-06-14 MED ORDER — ACETAMINOPHEN 500 MG PO TABS
1000.0000 mg | ORAL_TABLET | Freq: Once | ORAL | Status: AC
  Administered 2023-06-14: 1000 mg via ORAL
  Filled 2023-06-14: qty 2

## 2023-06-14 MED ORDER — NAPROXEN 375 MG PO TABS
375.0000 mg | ORAL_TABLET | Freq: Once | ORAL | Status: AC
  Administered 2023-06-14: 375 mg via ORAL
  Filled 2023-06-14: qty 1

## 2023-06-14 MED ORDER — METHOCARBAMOL 500 MG PO TABS: 500.0000 mg | ORAL_TABLET | Freq: Two times a day (BID) | ORAL | 0 refills | Status: AC

## 2023-06-14 MED ORDER — PREDNISONE 20 MG PO TABS
60.0000 mg | ORAL_TABLET | Freq: Once | ORAL | Status: AC
  Administered 2023-06-14: 60 mg via ORAL
  Filled 2023-06-14: qty 3

## 2023-06-14 MED ORDER — LIDOCAINE 5 % EX PTCH: 1.0000 | MEDICATED_PATCH | CUTANEOUS | 0 refills | Status: AC

## 2023-06-14 MED ORDER — METHOCARBAMOL 500 MG PO TABS
500.0000 mg | ORAL_TABLET | Freq: Once | ORAL | Status: AC
  Administered 2023-06-14: 500 mg via ORAL
  Filled 2023-06-14: qty 1

## 2023-06-14 MED ORDER — NAPROXEN 500 MG PO TABS: 500.0000 mg | ORAL_TABLET | Freq: Two times a day (BID) | ORAL | 0 refills | Status: AC

## 2023-06-14 NOTE — ED Provider Notes (Signed)
[place] EMERGENCY DEPARTMENT AT [facility] Provider Note   CSN: [id number] Arrival date & time: 06/14/23  0013     History  Chief Complaint  Patient presents with   Numbness   Foot Swelling    Trevor West is a 52 y.o. male.  The history is provided by the patient.  Back Pain Location:  Gluteal region Quality:  Shooting Radiates to: R leg. Pain is:  Same all the time Onset quality:  Gradual Duration:  3 weeks Timing:  Constant Progression:  Worsening Chronicity:  New Context: not MVA, not recent injury and not twisting   Relieved by:  Nothing Worsened by:  Nothing Ineffective treatments:  None tried Associated symptoms: no abdominal pain, no bladder incontinence, no fever, no pelvic pain, no perianal numbness, no weakness and no weight loss   Risk factors: no hx of cancer        Home Medications Prior to Admission medications   Medication Sig Start Date End Date Taking? Authorizing Provider  lidocaine (LIDODERM) 5 % Place 1 patch onto the skin daily. Remove & Discard patch within 12 hours or as directed by MD 06/14/23  Yes Magdalena Skilton, MD  methocarbamol (ROBAXIN) 500 MG tablet Take 1 tablet (500 mg total) by mouth 2 (two) times daily. 06/14/23  Yes Sammy Cassar, MD  naproxen (NAPROSYN) 500 MG tablet Take 1 tablet (500 mg total) by mouth 2 (two) times daily with a meal. 06/14/23  Yes Kinan Safley, MD  benzonatate (TESSALON) 100 MG capsule Take 1 capsule (100 mg total) by mouth 3 (three) times daily as needed for cough. Patient not taking: Reported on 06/07/2023 01/15/16   Antony Madura, PA-C  diclofenac Sodium (VOLTAREN) 1 % GEL Apply 2 g topically 4 (four) times daily. Patient not taking: Reported on 06/07/2023 01/09/21   Maia Plan, MD  fluticasone [facility]) 50 MCG/ACT nasal spray Place 2 sprays into both nostrils daily as needed (for nasal congestion). Patient not taking: Reported on 06/07/2023 01/15/16   Antony Madura, PA-C  ibuprofen  (ADVIL,MOTRIN) 200 MG tablet Take 400 mg by mouth every 6 (six) hours as needed for headache, mild pain or moderate pain.    [provider]  MOUNJARO 5 MG/0.5ML Pen Inject 5 mg into the skin once a week. 05/06/23   [provider]  naproxen (NAPROSYN) 500 MG tablet Take 1 tablet (500 mg total) by mouth 2 (two) times daily as needed for mild pain or moderate pain (body aches). Patient not taking: Reported on 06/07/2023 01/15/16   Antony Madura, PA-C  rosuvastatin (CRESTOR) 10 MG tablet Take 10 mg by mouth at bedtime. 04/13/23   [provider]      Allergies    Patient has no known allergies.    Review of Systems   Review of Systems  Constitutional:  Negative for fever and weight loss.  HENT:  Negative for facial swelling.   Eyes:  Negative for redness.  Respiratory:  Negative for shortness of breath, wheezing and stridor.   Gastrointestinal:  Negative for abdominal pain.  Genitourinary:  Negative for bladder incontinence and pelvic pain.  Musculoskeletal:  Positive for back pain.  Neurological:  Negative for weakness.  All other systems reviewed and are negative.   Physical Exam Updated Vital Signs BP 126/86 (BP Location: Right Arm)   Pulse 66   Temp 98 F (36.7 C) (Oral)   Resp 18   Ht 6' (1.829 m)   Wt (!) 154.2 kg  SpO2 98%   BMI 46.11 kg/m  Physical Exam Vitals and nursing note reviewed.  Constitutional:      General: He is not in acute distress.    Appearance: Normal appearance. He is well-developed. He is not diaphoretic.  HENT:     Head: Normocephalic and atraumatic.     Nose: Nose normal.  Eyes:     Conjunctiva/sclera: Conjunctivae normal.     Pupils: Pupils are equal, round, and reactive to light.  Cardiovascular:     Rate and Rhythm: Normal rate and regular rhythm.     Pulses: Normal pulses.     Heart sounds: Normal heart sounds.  Pulmonary:     Effort: Pulmonary effort is normal.     Breath sounds: Normal breath sounds. No wheezing  or rales.  Abdominal:     General: Bowel sounds are normal.     Palpations: Abdomen is soft.     Tenderness: There is no abdominal tenderness. There is no guarding or rebound.  Musculoskeletal:        General: Normal range of motion.     Cervical back: Normal range of motion and neck supple.  Skin:    General: Skin is warm and dry.     Capillary Refill: Capillary refill takes less than 2 seconds.  Neurological:     General: No focal deficit present.     Mental Status: He is alert and oriented to person, place, and time.     Sensory: No sensory deficit.     Deep Tendon Reflexes: Reflexes normal.     Comments: Intact L5/s1 intact sensation to all nerve distributions of the RLE  Psychiatric:        Mood and Affect: Mood normal.        Behavior: Behavior normal.     ED Results / Procedures / Treatments   Labs (all labs ordered are listed, but only abnormal results are displayed) Labs Reviewed - No data to display  EKG None  Radiology DG Lumbar Spine Complete  Result Date: 06/14/2023 CLINICAL DATA:  52 year old male with pain radiating to the right buttock, right leg to the foot. EXAM: LUMBAR SPINE - COMPLETE 4+ VIEW COMPARISON:  CT Abdomen and Pelvis 11/03/2021. FINDINGS: Normal lumbar segmentation. Bone mineralization is within normal limits. Stable to mildly improved lumbar lordosis compared to the CT last year. No spondylolisthesis. No pars fracture. Visible sacrum and SI joints appear intact. Relatively preserved disc spaces. Chronic moderate to severe facet arthropathy at L5-S1 remains asymmetrically worse on the right. Vacuum facet there on the previous CT. Associated bilateral L5 foraminal stenosis previously. Negative visible bowel gas pattern, abdominal and pelvic visceral contours. IMPRESSION: 1. No acute osseous abnormality identified in the lumbar spine. 2. Chronic moderate to severe facet arthropathy at L5-S1, worse on the right and with associated foraminal stenosis. Query  right L5 radiculitis. Electronically Signed   By: Odessa Fleming M.D.   On: 06/14/2023 05:50    Procedures Procedures    Medications Ordered in ED Medications  predniSONE (DELTASONE) tablet 60 mg (60 mg Oral Given 06/14/23 0530)  methocarbamol (ROBAXIN) tablet 500 mg (500 mg Oral Given 06/14/23 0530)  acetaminophen (TYLENOL) tablet 1,000 mg (1,000 mg Oral Given 06/14/23 0530)  naproxen (NAPROSYN) tablet 375 mg (375 mg Oral Given 06/14/23 0531)    ED Course/ Medical Decision Making/ A&P  Medical Decision Making Buttock and RLE x 3 weeks   Amount and/or Complexity of Data Reviewed Independent Historian: spouse    Details: See above  External Data Reviewed: notes.    Details: Previous notes reviewed  Radiology: ordered and independent interpretation performed.    Details: No fractures   Risk OTC drugs. Prescription drug management. Risk Details: Will treat for sciatica and have patient follow up with spine for ongoing care.  Medication ordered.  Stable for discharge with close follow up     Final Clinical Impression(s) / ED Diagnoses Final diagnoses:  Sciatica of right side  Lumbar foraminal stenosis   Return for intractable cough, coughing up blood, fevers > 100.4 unrelieved by medication, shortness of breath, intractable vomiting, chest pain, shortness of breath, weakness, numbness, changes in speech, facial asymmetry, abdominal pain, passing out, Inability to tolerate liquids or food, cough, altered mental status or any concerns. No signs of systemic illness or infection. The patient is nontoxic-appearing on exam and vital signs are within normal limits.  I have reviewed the triage vital signs and the nursing notes. Pertinent labs & imaging results that were available during my care of the patient were reviewed by me and considered in my medical decision making (see chart for details). After history, exam, and medical workup I feel the patient has been  appropriately medically screened and is safe for discharge home. Pertinent diagnoses were discussed with the patient. Patient was given return precautions.  Rx / DC Orders ED Discharge Orders          Ordered    naproxen (NAPROSYN) 500 MG tablet  2 times daily with meals        06/14/23 0630    methocarbamol (ROBAXIN) 500 MG tablet  2 times daily        06/14/23 0630    lidocaine (LIDODERM) 5 %  Every 24 hours        06/14/23 0630              Huy Majid, MD 06/14/23 [phone]

## 2023-06-14 NOTE — ED Triage Notes (Signed)
Pt reports with right foot pain, swelling, and numbness from his right buttock down to his foot x 2 weeks. Pt states that he is pre diabetic.

## 2023-08-18 IMAGING — CT CT ABD-PELV W/O CM
2 of 4 series · 16 of 46 positions shown, 18 images · non-contrast
Comparison: None.

CLINICAL DATA: Gross hematuria

EXAM:
CT ABDOMEN AND PELVIS WITHOUT CONTRAST
TECHNIQUE: Multidetector CT imaging of the abdomen and pelvis was performed
following the standard protocol without IV contrast.

[Series 2: renal stone 5.00 br40 s3 axial · axial · 0.90mm/px · z∈[+1212,+1677]mm · 13 of 103 slices shown, 15 images]
[im 5/103  soft-tissue]
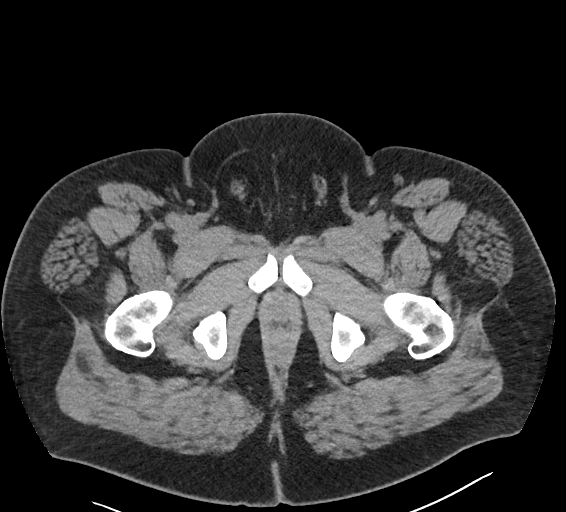
[im 5/103  bone]
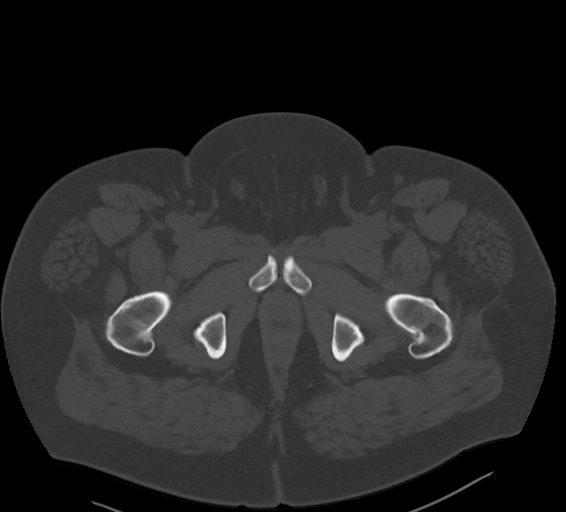
[im 14/103  soft-tissue]
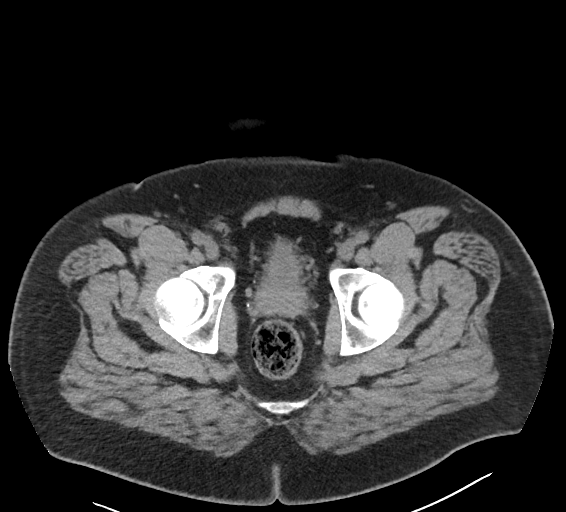
[im 23/103  soft-tissue]
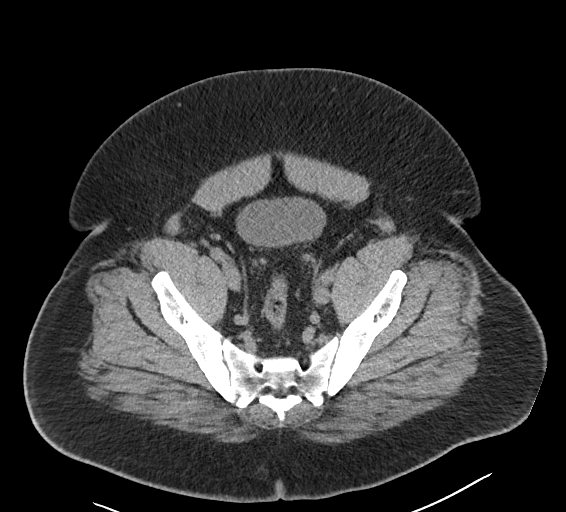
[im 27/103  soft-tissue]
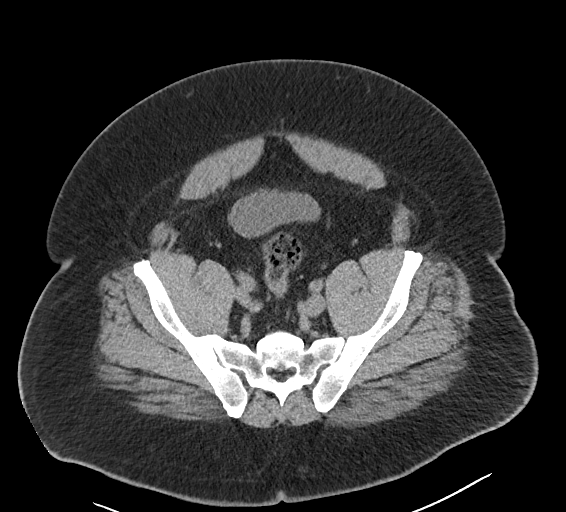
[im 36/103  soft-tissue]
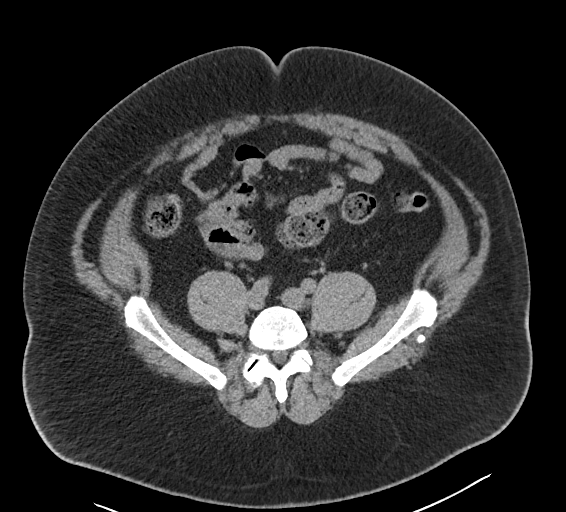
[im 45/103  soft-tissue]
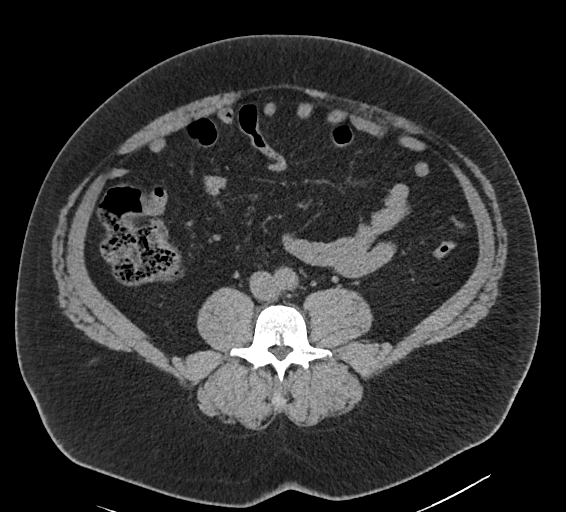
[im 54/103  soft-tissue]
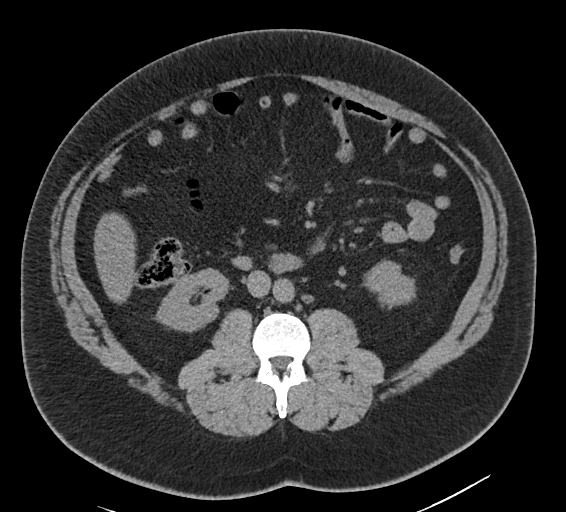
[im 58/103  soft-tissue]
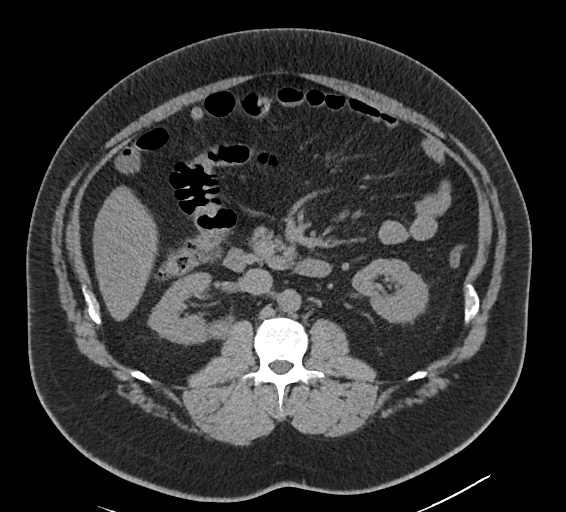
[im 67/103  soft-tissue]
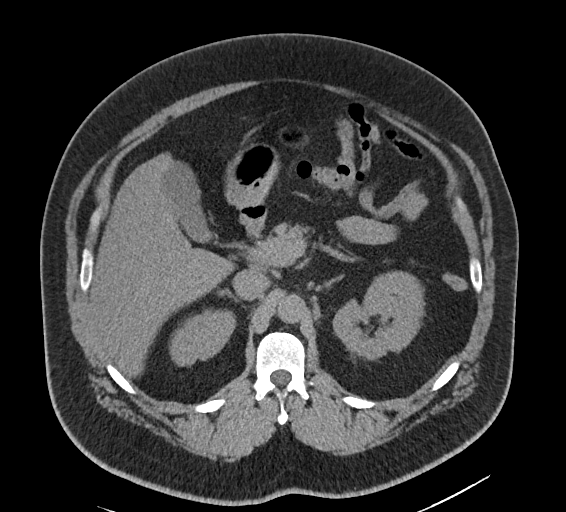
[im 67/103  bone]
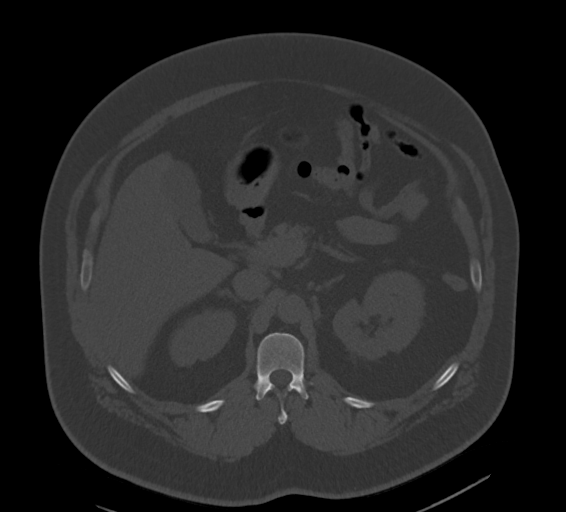
[im 76/103  soft-tissue]
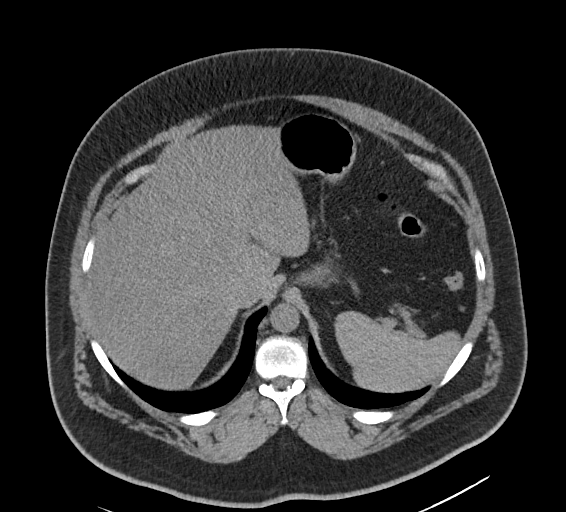
[im 80/103  soft-tissue]
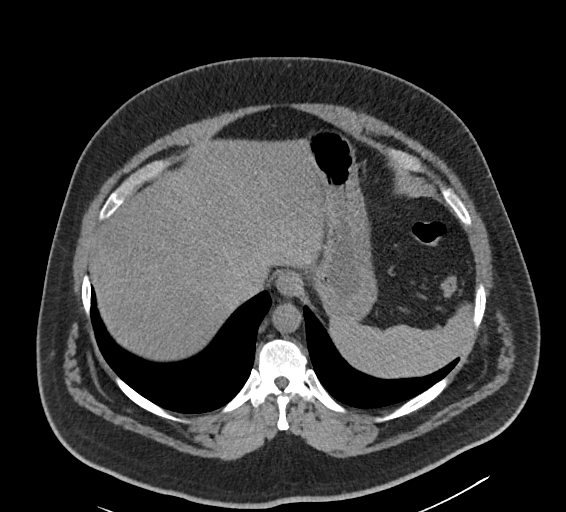
[im 89/103  soft-tissue]
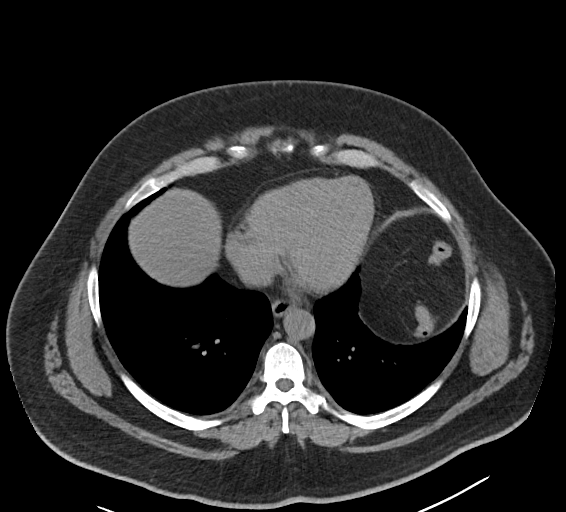
[im 98/103  soft-tissue]
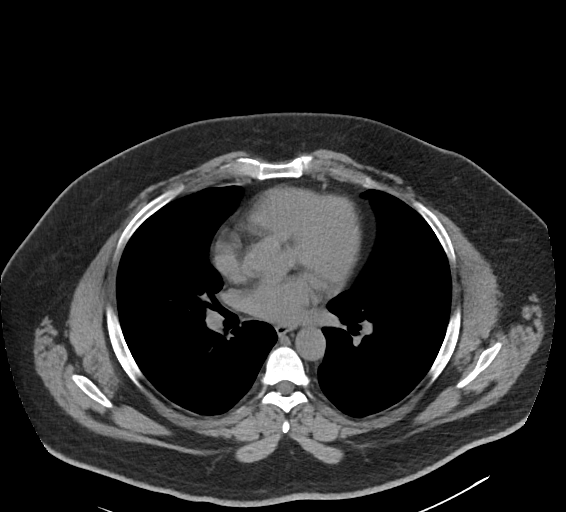

[Series 6: renal stone 2.00 br40 s3 cor · coronal · 0.99mm/px · 3 of 223 slices shown]
[im 75/223  soft-tissue]
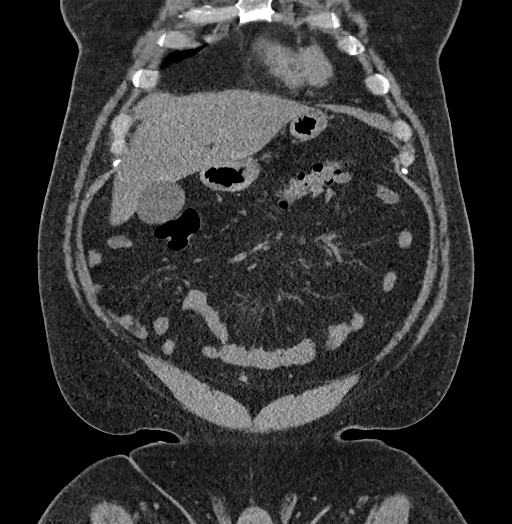
[im 99/223  soft-tissue]
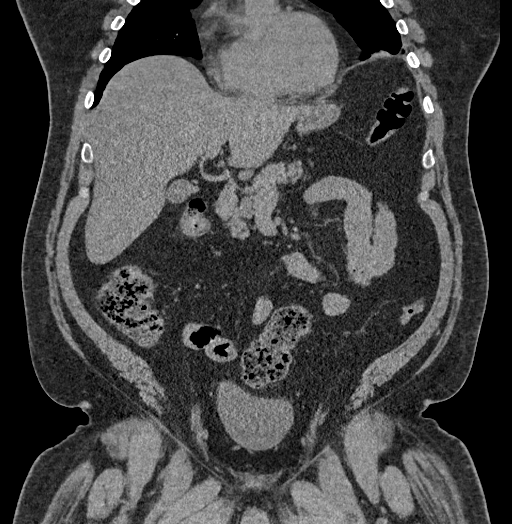
[im 124/223  soft-tissue]
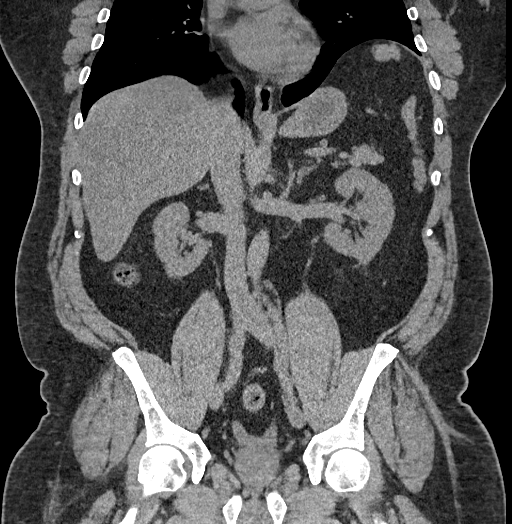

[16 of 46 positions shown; findings below may reference images not displayed]

FINDINGS: Lower chest: No acute abnormality.

Hepatobiliary: Hepatic steatosis. Gallbladder is unremarkable. No
biliary dilatation.

Pancreas: Unremarkable.

Spleen: Unremarkable.

Adrenals/Urinary Tract: Adrenals are unremarkable. No renal calculi.
No hydronephrosis. Partially distended bladder is unremarkable.

Stomach/Bowel: Stomach is within normal limits. Bowel is normal in
caliber. Normal appendix.

Vascular/Lymphatic: No significant vascular abnormality on this
noncontrast study. No enlarged nodes.

Reproductive: Unremarkable.

Other: No free fluid.  Unremarkable abdominal wall.

Musculoskeletal: No acute osseous abnormality. Right L5-S1 facet
arthropathy.
IMPRESSION: No acute abnormality.  Specifically, no urinary tract calculi.

Hepatic steatosis.

## 2023-10-03 ENCOUNTER — Emergency Department (HOSPITAL_COMMUNITY): Payer: BC Managed Care – PPO

## 2023-10-03 ENCOUNTER — Emergency Department (HOSPITAL_COMMUNITY)
Admission: EM | Admit: 2023-10-03 | Discharge: 2023-10-03 | Disposition: A | Discharge status: Home / Self Care | Payer: BC Managed Care – PPO | Attending: Emergency Medicine | Admitting: Emergency Medicine

## 2023-10-03 ENCOUNTER — Other Ambulatory Visit: Payer: Self-pay

## 2023-10-03 ENCOUNTER — Encounter (HOSPITAL_COMMUNITY): Payer: Self-pay

## 2023-10-03 DIAGNOSIS — R079 Chest pain, unspecified: Secondary | ICD-10-CM | POA: Insufficient documentation

## 2023-10-03 DIAGNOSIS — R0789 Other chest pain: Secondary | ICD-10-CM

## 2023-10-03 DIAGNOSIS — E119 Type 2 diabetes mellitus without complications: Secondary | ICD-10-CM | POA: Insufficient documentation

## 2023-10-03 DIAGNOSIS — R1011 Right upper quadrant pain: Secondary | ICD-10-CM | POA: Insufficient documentation

## 2023-10-03 DIAGNOSIS — R000 Tachycardia, unspecified: Secondary | ICD-10-CM | POA: Diagnosis not present

## 2023-10-03 DIAGNOSIS — Z1152 Encounter for screening for COVID-19: Secondary | ICD-10-CM | POA: Insufficient documentation

## 2023-10-03 DIAGNOSIS — I10 Essential (primary) hypertension: Secondary | ICD-10-CM | POA: Insufficient documentation

## 2023-10-03 LAB — HEPATIC FUNCTION PANEL
ALT: 22 U/L (ref 0–44)
AST: 18 U/L (ref 15–41)
Albumin: 4.1 g/dL (ref 3.5–5.0)
Alkaline Phosphatase: 48 U/L (ref 38–126)
Bilirubin, Direct: 0.1 mg/dL (ref 0.0–0.2)
Indirect Bilirubin: 1 mg/dL — ABNORMAL HIGH (ref 0.3–0.9)
Total Bilirubin: 1.1 mg/dL (ref <1.2)
Total Protein: 7.5 g/dL (ref 6.5–8.1)

## 2023-10-03 LAB — CBC
HCT: 46.5 % (ref 39.0–52.0)
Hemoglobin: 14.7 g/dL (ref 13.0–17.0)
MCH: 25.8 pg — ABNORMAL LOW (ref 26.0–34.0)
MCHC: 31.6 g/dL (ref 30.0–36.0)
MCV: 81.7 fL (ref 80.0–100.0)
Platelets: 273 10*3/uL (ref 150–400)
RBC: 5.69 MIL/uL (ref 4.22–5.81)
RDW: 14.3 % (ref 11.5–15.5)
WBC: 6 10*3/uL (ref 4.0–10.5)
nRBC: 0 % (ref 0.0–0.2)

## 2023-10-03 LAB — BASIC METABOLIC PANEL
Anion gap: 8 (ref 5–15)
BUN: 13 mg/dL (ref 6–20)
CO2: 23 mmol/L (ref 22–32)
Calcium: 8.8 mg/dL — ABNORMAL LOW (ref 8.9–10.3)
Chloride: 105 mmol/L (ref 98–111)
Creatinine, Ser: 1.13 mg/dL (ref 0.61–1.24)
GFR, Estimated: >60 mL/min (ref >60)
Glucose, Bld: 113 mg/dL — ABNORMAL HIGH (ref 70–99)
Potassium: 3.8 mmol/L (ref 3.5–5.1)
Sodium: 136 mmol/L (ref 135–145)

## 2023-10-03 LAB — D-DIMER, QUANTITATIVE: D-Dimer, Quant: 0.66 ug{FEU}/mL — ABNORMAL HIGH (ref 0.00–0.50)

## 2023-10-03 LAB — TROPONIN I (HIGH SENSITIVITY)
Troponin I (High Sensitivity): 2 ng/L (ref <18)
Troponin I (High Sensitivity): 2 ng/L (ref <18)

## 2023-10-03 LAB — RESP PANEL BY RT-PCR (RSV, FLU A&B, COVID)  RVPGX2
Influenza A by PCR: NEGATIVE
Influenza B by PCR: NEGATIVE
Resp Syncytial Virus by PCR: NEGATIVE
SARS Coronavirus 2 by RT PCR: NEGATIVE

## 2023-10-03 LAB — LIPASE, BLOOD: Lipase: 25 U/L (ref 11–51)

## 2023-10-03 MED ORDER — FENTANYL CITRATE PF 50 MCG/ML IJ SOSY
50.0000 ug | PREFILLED_SYRINGE | Freq: Once | INTRAMUSCULAR | Status: AC
Start: 2023-10-03 — End: 2023-10-03
  Administered 2023-10-03: 50 ug via INTRAVENOUS
  Filled 2023-10-03: qty 1

## 2023-10-03 MED ORDER — ONDANSETRON 4 MG PO TBDP: ORAL_TABLET | ORAL | 0 refills | Status: AC

## 2023-10-03 MED ORDER — PANTOPRAZOLE SODIUM 20 MG PO TBEC: 20.0000 mg | DELAYED_RELEASE_TABLET | Freq: Every day | ORAL | 0 refills | Status: AC

## 2023-10-03 MED ORDER — ONDANSETRON HCL 4 MG/2ML IJ SOLN
4.0000 mg | Freq: Once | INTRAMUSCULAR | Status: AC
  Administered 2023-10-03: 4 mg via INTRAVENOUS
  Filled 2023-10-03: qty 2

## 2023-10-03 MED ORDER — DICYCLOMINE HCL 20 MG PO TABS: ORAL_TABLET | ORAL | 0 refills | Status: AC

## 2023-10-03 MED ORDER — IOHEXOL 350 MG/ML SOLN
100.0000 mL | Freq: Once | INTRAVENOUS | Status: AC | PRN
  Administered 2023-10-03: 100 mL via INTRAVENOUS

## 2023-10-03 NOTE — ED Provider Notes (Signed)
[place] EMERGENCY DEPARTMENT AT [facility] Provider Note   CSN: [id number] Arrival date & time: 10/03/23  0304     History  Chief Complaint  Patient presents with   Chest Pain    Trevor West is a 52 y.o. male.  The history is provided by the patient and medical records.  Chest Pain Trevor West is a 52 y.o. male who presents to the Emergency Department complaining of chest pain and abdominal pain.  He presents to the emergency department for evaluation of symptoms that started last night.  Initially he developed upper abdominal pain described as a fullness sensation that is constant and nonradiating.  He has associated nausea, belching and liquid stools that are nonbloody.  Pain is achy in nature.  A little bit later he developed central chest discomfort that waxes and wanes.  He does have some occasional shortness of breath.  No pain on breathing.  No vomiting, dysuria, leg swelling or pain.  No recent surgeries.  No prior abdominal surgery.  He has a history of diabetes, hypertension.  A possible remote history of clot 20 or more years ago. No prior similar symptoms.  No tob, occ alcohol, no drugs.       Home Medications Prior to Admission medications   Medication Sig Start Date End Date Taking? Authorizing Provider  MOUNJARO 5 MG/0.5ML Pen Inject 5 mg into the skin once a week. 05/06/23  Yes [provider]  rosuvastatin (CRESTOR) 10 MG tablet Take 10 mg by mouth at bedtime. 04/13/23  Yes [provider]  benzonatate (TESSALON) 100 MG capsule Take 1 capsule (100 mg total) by mouth 3 (three) times daily as needed for cough. Patient not taking: Reported on 06/07/2023 01/15/16   Antony Madura, PA-C  diclofenac Sodium (VOLTAREN) 1 % GEL Apply 2 g topically 4 (four) times daily. Patient not taking: Reported on 06/07/2023 01/09/21   Maia Plan, MD  fluticasone [facility]) 50 MCG/ACT nasal spray Place 2 sprays into both nostrils daily as needed (for  nasal congestion). Patient not taking: Reported on 06/07/2023 01/15/16   Antony Madura, PA-C  ibuprofen (ADVIL,MOTRIN) 200 MG tablet Take 400 mg by mouth every 6 (six) hours as needed for headache, mild pain or moderate pain. Patient not taking: Reported on 10/03/2023    [provider]  lidocaine (LIDODERM) 5 % Place 1 patch onto the skin daily. Remove & Discard patch within 12 hours or as directed by MD Patient not taking: Reported on 10/03/2023 06/14/23   Palumbo, April, MD  methocarbamol (ROBAXIN) 500 MG tablet Take 1 tablet (500 mg total) by mouth 2 (two) times daily. Patient not taking: Reported on 10/03/2023 06/14/23   Palumbo, April, MD  naproxen (NAPROSYN) 500 MG tablet Take 1 tablet (500 mg total) by mouth 2 (two) times daily as needed for mild pain or moderate pain (body aches). Patient not taking: Reported on 06/07/2023 01/15/16   Antony Madura, PA-C  naproxen (NAPROSYN) 500 MG tablet Take 1 tablet (500 mg total) by mouth 2 (two) times daily with a meal. Patient not taking: Reported on 10/03/2023 06/14/23   Nicanor Alcon, April, MD      Allergies    Patient has no known allergies.    Review of Systems   Review of Systems  Cardiovascular:  Positive for chest pain.  All other systems reviewed and are negative.   Physical Exam Updated Vital Signs BP (!) 141/90 (BP Location: Left Arm)   Pulse 87  Temp 99.5 F (37.5 C) (Oral)   Resp 19   Ht 6' (1.829 m)   Wt (!) 156 kg   SpO2 98%   BMI 46.65 kg/m  Physical Exam Vitals and nursing note reviewed.  Constitutional:      Appearance: He is well-developed.  HENT:     Head: Normocephalic and atraumatic.  Cardiovascular:     Rate and Rhythm: Regular rhythm. Tachycardia present.     Heart sounds: No murmur heard. Pulmonary:     Effort: Pulmonary effort is normal. No respiratory distress.     Breath sounds: Normal breath sounds.  Abdominal:     Palpations: Abdomen is soft.     Tenderness: There is no guarding or rebound.      Comments: Mild to moderate right upper quadrant tenderness  Musculoskeletal:        General: No tenderness.  Skin:    General: Skin is warm and dry.  Neurological:     Mental Status: He is alert and oriented to person, place, and time.  Psychiatric:        Behavior: Behavior normal.     ED Results / Procedures / Treatments   Labs (all labs ordered are listed, but only abnormal results are displayed) Labs Reviewed  BASIC METABOLIC PANEL - Abnormal; Notable for the following components:      Result Value   Glucose, Bld 113 (*)    Calcium 8.8 (*)    All other components within normal limits  CBC - Abnormal; Notable for the following components:   MCH 25.8 (*)    All other components within normal limits  HEPATIC FUNCTION PANEL - Abnormal; Notable for the following components:   Indirect Bilirubin 1.0 (*)    All other components within normal limits  D-DIMER, QUANTITATIVE - Abnormal; Notable for the following components:   D-Dimer, Quant 0.66 (*)    All other components within normal limits  RESP PANEL BY RT-PCR (RSV, FLU A&B, COVID)  RVPGX2  LIPASE, BLOOD  TROPONIN I (HIGH SENSITIVITY)  TROPONIN I (HIGH SENSITIVITY)    EKG EKG Interpretation Date/Time:  Wednesday October 03 2023 03:12:50 EST Ventricular Rate:  106 PR Interval:  185 QRS Duration:  106 QT Interval:  351 QTC Calculation: 467 R Axis:   -22  Text Interpretation: Sinus tachycardia Borderline left axis deviation Abnormal R-wave progression, early transition Confirmed by Tilden Fossa [phone]) on 10/03/2023 3:25:55 AM  Radiology US Abdomen Limited RUQ (LIVER/GB) Result Date: 10/03/2023 CLINICAL DATA:  Right upper quadrant abdominal pain EXAM: ULTRASOUND ABDOMEN LIMITED RIGHT UPPER QUADRANT COMPARISON:  10/24/2021 FINDINGS: Gallbladder: No gallstones or wall thickening visualized. No sonographic Murphy sign noted by sonographer. Common bile duct: Diameter: 5.0 Liver: No focal lesion identified. Diffusely  increased parenchymal echogenicity. Portal vein is patent on color Doppler imaging with normal direction of blood flow towards the liver. Other: None. IMPRESSION: 1. No acute findings. 2. Diffusely increased hepatic parenchymal echogenicity is a nonspecific indicator of hepatocellular dysfunction, most commonly steatosis. Electronically Signed   By: Signa Kell M.D.   On: 10/03/2023 05:17   DG Chest 2 View Result Date: 10/03/2023 CLINICAL DATA:  Chest pain EXAM: CHEST - 2 VIEW COMPARISON:  01/09/2021 FINDINGS: Gaseous distension of the stomach with elevation of left hemidiaphragm noted. Mild left basilar atelectasis. Lungs are otherwise clear. No pneumothorax or pleural effusion. Cardiac size within normal limits. Pulmonary vascularity is normal. No acute bone abnormality. IMPRESSION: 1. Gaseous distension of the stomach with elevation of the left hemidiaphragm.  Mild left basilar atelectasis. Electronically Signed   By: Helyn Numbers M.D.   On: 10/03/2023 03:42    Procedures Procedures    Medications Ordered in ED Medications  fentaNYL (SUBLIMAZE) injection 50 mcg (50 mcg Intravenous Given 10/03/23 0416)  ondansetron (ZOFRAN) injection 4 mg (4 mg Intravenous Given 10/03/23 0416)  iohexol (OMNIPAQUE) 350 MG/ML injection 100 mL (100 mLs Intravenous Contrast Given 10/03/23 7829)    ED Course/ Medical Decision Making/ A&P                                 Medical Decision Making Amount and/or Complexity of Data Reviewed Labs: ordered. Radiology: ordered.  Risk Prescription drug management.   Patient here for evaluation of chest pain, epigastric pain and belching.  He does have right upper quadrant tenderness on examination.  Right upper quadrant ultrasound is negative for acute abnormality.  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  D-dimer was obtained, which was elevated.  Plan to obtain CT chest on pelvis to further evaluate chest pain as well as abdominal pain.  Patient care transferred  pending imaging.        Final Clinical Impression(s) / ED Diagnoses Final diagnoses:  None    Rx / DC Orders ED Discharge Orders     None         Tilden Fossa, MD 10/03/23 0710

## 2023-10-03 NOTE — Discharge Instructions (Signed)
Follow-up with your family doctor next week.  He can address the finding on your left kidney.  Return if problems

## 2023-10-03 NOTE — ED Provider Notes (Signed)
Patient CT of chest negative for PE.  Abdominal CT shows no acute changes but finding on the left kidney.  He will follow-up with family doctor for that.  He also will be put on Protonix Zofran and Bentyl and follow-up with his PCP   Bethann Berkshire, MD 10/03/23 [phone]

## 2023-10-03 NOTE — ED Triage Notes (Signed)
Pt reports midsternal chest pain that began last night at 2200 while he was lying down. Pt also reports that he feels like his abdomen is bloated and has had increased belching.

## 2023-10-03 NOTE — ED Notes (Signed)
Patient transported to CT
